# Patient Record
Sex: Male | Born: 1940 | Race: White | Hispanic: No | Marital: Married
Health system: Midwestern US, Academic
[De-identification: ages and names within clinical notes are randomized; demographics above are authoritative.]

---

## 2016-09-27 MED ORDER — SODIUM CHLORIDE 0.9 % IJ SOLN
50 mL | Freq: Once | INTRAVENOUS | 0 refills | Status: AC
Start: 2016-09-27 — End: ?

## 2016-09-27 MED ORDER — GADOBENATE DIMEGLUMINE 529 MG/ML (0.1MMOL/0.2ML) IV SOLN
16 mL | Freq: Once | INTRAVENOUS | 0 refills | Status: CP
Start: 2016-09-27 — End: ?

## 2016-10-02 MED ORDER — CIPROFLOXACIN HCL 500 MG PO TAB
500 mg | ORAL_TABLET | Freq: Two times a day (BID) | ORAL | 0 refills | 10.00000 days | Status: AC
Start: 2016-10-02 — End: ?

## 2016-10-25 ENCOUNTER — Encounter: Admit: 2016-10-25 | Discharge: 2016-10-25 | Payer: MEDICARE

## 2016-10-25 NOTE — Telephone Encounter
Called and went over pre bx instructions in detail, especially holding asa & plavix ok to hold for 7 days before appointment and 1 day after. Pt expressed understanding and has no further questions at this time.

## 2016-10-29 ENCOUNTER — Ambulatory Visit: Admit: 2016-10-29 | Discharge: 2016-10-29 | Payer: MEDICARE

## 2016-10-29 ENCOUNTER — Encounter: Admit: 2016-10-29 | Discharge: 2016-10-29 | Payer: MEDICARE

## 2016-10-29 DIAGNOSIS — R3129 Other microscopic hematuria: ICD-10-CM

## 2016-10-29 DIAGNOSIS — N400 Enlarged prostate without lower urinary tract symptoms: Principal | ICD-10-CM

## 2016-10-29 DIAGNOSIS — M5410 Radiculopathy, site unspecified: ICD-10-CM

## 2016-10-29 DIAGNOSIS — I219 Acute myocardial infarction, unspecified: ICD-10-CM

## 2016-10-29 DIAGNOSIS — I209 Angina pectoris, unspecified: Principal | ICD-10-CM

## 2016-10-29 DIAGNOSIS — I10 Essential (primary) hypertension: ICD-10-CM

## 2016-10-29 DIAGNOSIS — E785 Hyperlipidemia, unspecified: ICD-10-CM

## 2016-10-29 DIAGNOSIS — I2510 Atherosclerotic heart disease of native coronary artery without angina pectoris: ICD-10-CM

## 2016-10-29 DIAGNOSIS — R972 Elevated prostate specific antigen [PSA]: ICD-10-CM

## 2016-10-29 DIAGNOSIS — N200 Calculus of kidney: ICD-10-CM

## 2016-10-29 DIAGNOSIS — I25118 Atherosclerotic heart disease of native coronary artery with other forms of angina pectoris: ICD-10-CM

## 2016-10-29 DIAGNOSIS — M1990 Unspecified osteoarthritis, unspecified site: ICD-10-CM

## 2016-10-29 DIAGNOSIS — K649 Unspecified hemorrhoids: ICD-10-CM

## 2016-10-29 DIAGNOSIS — Z0000 Encounter for general adult medical examination without abnormal findings: ICD-10-CM

## 2016-10-29 DIAGNOSIS — J302 Other seasonal allergic rhinitis: ICD-10-CM

## 2016-10-29 NOTE — Assessment & Plan Note
-   Reactions (myopathy & elevated LFTs) to statins  - x10 cardiac stents placed  > Request records from [facility], check FLP depending on records of when last evaluated  > Continue zetia & welchol

## 2016-10-29 NOTE — Assessment & Plan Note
-   Incidental finding on CT A/P at OSH  - PSA elevated > 6 & imaging on MRI with multiple nodules seen  - Current regimen: Flomax  > Continue flomax  > Urology following, planning for fusion biopsy 11/07/16. Continue to follow with urology.

## 2016-10-29 NOTE — Assessment & Plan Note
-   Mildly elevated today w/ SBP 142  - Will continue to monitor as only value of file in past year   > Continue lopressor 50 mg BID and will continue to monitor at each visit

## 2016-10-29 NOTE — Assessment & Plan Note
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

## 2016-10-29 NOTE — Assessment & Plan Note
-   Noted on colonoscopy in 2011 to have multiple medium sized internal hemorrhoids  - Patient noticed some transient bleeding reportedly with tylenol  - Currently on DAPT  - Suspect internal hemorrhoidal bleeding with concurrent DAPT  > Instructed patient to continue tylenol as needed for radicular symptoms and to call if further bleeding to workup further  > CBC today to evaluate for anemia

## 2016-10-29 NOTE — Assessment & Plan Note
-   Follows with Dr. Gilmer Mor  - History of x4 PCI with x10 stents placed  - Current regimen: ASA 81mg  qday; plavix 75mg  qday; niacin 500mg  qday; ezetimibe 10mg  qday; colesevelam 1875mg  BID  > Continue current regimen  > Has clearance per chart from Dr. Alton Revere office to be off of DAPT 7 days prior and 24 hours post fusion biopsy of prostate

## 2016-10-29 NOTE — Progress Notes
I discussed the care of Mr.Ledbetter with Dr. Ofilia Neas, DO at the time of the visit, and I agree with the evaluation and plan as documented by this resident, unless noted below, or in the resident's note in bold, blue italics.    Problem   Arm Paresthesia, Right        Encounter Diagnoses   Name Primary?    Enlarged prostate Yes    Elevated PSA     Coronary artery disease of native heart with stable angina pectoris, unspecified vessel or lesion type [facility])     Health care maintenance     Essential hypertension     Hyperlipidemia, unspecified hyperlipidemia type         Orders Placed This Encounter    PNEUMOCOCCAL CONJ VACCINE 13-VAL       Sharilyn Sites, MD

## 2016-10-29 NOTE — Progress Notes
Date of Service: 10/29/2016    Timothy Sweeney is a 76 y.o. male. DOB: [birth year]   MRN#: [id number]    Subjective:       History of Present Illness  Timothy Sweeney presents to clinic today for 3 month follow up. He reports having right sided arm numbness that has been going on for year. He occasionally has this on the left as well. The sensation goes from his upper shoulder all the way to his wrist and occasionally into all of his fingers. It is intermittent in nature. The sensation bothers him the most at night, but nothing else seems to exacerbate it.  He has been taking tylenol extra strength and tylenol PM at night which help. Moving in the morning seems to help as well. He stopped taking tylenol due to rectal bleeding that occurred and has since ceased, this was painless and minimal described a small amount of bright red blood in toilet which he thought might be due to tylenol. The numbness/tingling is not associated with any weakness, pain, or permanent loss of sensation.    In regards to health care maintenance Timothy Sweeney has not had any recent immunizations. He had a colonoscopy in 2011 without any polyps or masses. He is currently being worked up for enlarged prostate with elevated PSA and concerning MRI. He does not smoke nor has he ever.       Review of Systems   Musculoskeletal: Positive for arthritis, joint pain and joint swelling. Negative for muscle cramps, muscle weakness, myalgias and neck pain.   Gastrointestinal: Positive for hematochezia.   Genitourinary: Negative.    Neurological: Positive for numbness and paresthesias. Negative for focal weakness and sensory change.         Objective:     ??? acetaminophen (TYLENOL) 500 mg tablet Take 1-2 tablets by mouth every 8 hours as needed for Pain. Max of 4,000 mg of acetaminophen in 24 hours.   ??? aspirin EC 81 mg tablet Take 81 mg by mouth daily.   ??? clopiDOGrel (PLAVIX) 75 mg tablet Take 75 mg by mouth daily. ??? colesevelam(+) (WELCHOL) 625 mg tablet Take 1,875 mg by mouth twice daily with meals.   ??? ezetimibe (ZETIA) 10 mg tablet Take 10 mg by mouth daily.   ??? metoprolol tartrate (LOPRESSOR) 50 mg tablet Take 50 mg by mouth twice daily.   ??? Niacin 500 mg cpER Take  by mouth.   ??? nitroglycerin (NITROSTAT) 0.4 mg tablet Place 0.4 mg under tongue every 5 minutes as needed.   ??? tamsulosin (FLOMAX) 0.4 mg capsule TAKE ONE CAPSULE BY MOUTH EVERY DAY     Vitals:    10/29/16 1039   BP: 138/80   Pulse: 60   Resp: 16   Temp: 36.7 ???C (98 ???F)   TempSrc: Oral   Weight: 82.5 kg (181 lb 12.8 oz)   Height: 185.4 cm (73)     Body mass index is 23.99 kg/m???.     Physical Exam   Constitutional: He appears well-developed and well-nourished. No distress.   HENT:   Head: Normocephalic and atraumatic.   Mouth/Throat: Oropharynx is clear and moist.   Eyes: Conjunctivae and EOM are normal. Right eye exhibits no discharge. Left eye exhibits no discharge. No scleral icterus.   Neck: Neck supple. No thyromegaly present.   Decreased AROM/PROM especially with left side bending and bilateral rotation.   Cardiovascular: Normal rate, regular rhythm, normal heart sounds and intact distal pulses.  Exam reveals no gallop  and no friction rub.    No murmur heard.  Pulmonary/Chest: Effort normal and breath sounds normal. No respiratory distress. He has no wheezes. He has no rales.   Abdominal: There is no hepatosplenomegaly. There is no rigidity, no CVA tenderness, no tenderness at McBurney's point and negative Murphy's sign. Hernia confirmed negative in the ventral area.   Musculoskeletal: Normal range of motion. He exhibits no edema, tenderness or deformity.   Full ROM in hip joint  5/5 shoulder, elbow, forearm, wrist, and grip strength without exacerbation of symptoms   No tenderness to palpation on spinous processes of neck   Neurological: He is alert.   Skin: Skin is warm and dry. No rash noted. He is not diaphoretic. No erythema. Psychiatric: He has a normal mood and affect. His behavior is normal.          Assessment and Plan:      Problem   Radiculopathy Affecting Upper Extremity   Hemorrhoids   Cad (Coronary Artery Disease)   Health Care Maintenance   Enlarged Prostate    by imaging        Hyperlipidemia   Hypertension       Hypertension  - Mildly elevated today w/ SBP 142  - Will continue to monitor as only value of file in past year   > Continue lopressor 50 mg BID and will continue to monitor at each visit    Hyperlipidemia  - Reactions (myopathy & elevated LFTs) to statins  - x10 cardiac stents placed  > Request records from [facility], check FLP depending on records of when last evaluated  > Continue zetia & welchol    Health care maintenance  - Cancer Screening:    - CRC: 03/2010 without evidence of polyps or masses, will need repeat 2021   - Prostate: Elevated PSA & concerning findings on MRI, scheduled for fusion biopsy 11/07/16   - Lung: Never a smoker  - Immunizations: Unsure when last vaccine was (no flu, pneumonia, zoster, or recent tdap)   - Prevnar 13 10/29/16  - CAD: On DAPT   - Smoker: Never a smoker  - EtOH: Non-drinker  - Substance abuse: Denies use  > Prevnar 13 today  > Recommended shingrix and tdap to patient  > CMP, lipid profile, and CMP today for medication monitoring and evaluation of possible anemia as above        CAD (coronary artery disease)  - Follows with Dr. Janyth Contes  - History of x4 PCI with x10 stents placed  - Current regimen: ASA 81mg  qday; plavix 75mg  qday; niacin 500mg  qday; ezetimibe 10mg  qday; colesevelam 1875mg  BID  > Continue current regimen  > Has clearance per chart from Dr. 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  > C-spine 6 view > Continue tylenol, instructed to not exceed 3-4 grams/day    Hemorrhoids  - Noted on colonoscopy in 2011 to have multiple medium sized internal hemorrhoids  - Patient noticed some transient bleeding reportedly with tylenol  - Currently on DAPT  - Suspect internal hemorrhoidal bleeding with concurrent DAPT  > Instructed patient to continue tylenol as needed for radicular symptoms and to call if further bleeding to workup further  > CBC  today to evaluate for anemia    Enlarged prostate  - Incidental finding on CT A/P at OSH  - PSA elevated > 6 & imaging on MRI with multiple nodules seen  - Current regimen: Flomax  > Continue flomax  > Urology following, planning for fusion biopsy 11/07/16. Continue to follow with urology.                RTC in 6 months    Patient discussed with Dr. Charna Archer.    Rosetta Posner, DO  Internal Medicine, PGY-1  Pager: [phone]

## 2016-10-31 ENCOUNTER — Ambulatory Visit: Admit: 2016-10-31 | Discharge: 2016-10-31 | Payer: MEDICARE

## 2016-10-31 DIAGNOSIS — I2510 Atherosclerotic heart disease of native coronary artery without angina pectoris: ICD-10-CM

## 2016-10-31 DIAGNOSIS — M5410 Radiculopathy, site unspecified: ICD-10-CM

## 2016-10-31 DIAGNOSIS — N400 Enlarged prostate without lower urinary tract symptoms: ICD-10-CM

## 2016-10-31 DIAGNOSIS — E785 Hyperlipidemia, unspecified: ICD-10-CM

## 2016-10-31 DIAGNOSIS — R972 Elevated prostate specific antigen [PSA]: ICD-10-CM

## 2016-10-31 DIAGNOSIS — K649 Unspecified hemorrhoids: ICD-10-CM

## 2016-10-31 DIAGNOSIS — I10 Essential (primary) hypertension: Principal | ICD-10-CM

## 2016-10-31 LAB — COMPREHENSIVE METABOLIC PANEL
Lab: 138 MMOL/L (ref >60)
Lab: 15 U/L (ref 7–56)
Lab: 29 MMOL/L (ref 21–30)
Lab: 3 (ref 3–12)
Lab: 4.7 MMOL/L (ref >60)
Lab: >60 mL/min (ref >60)
Lab: >60 mL/min (ref >60)

## 2016-10-31 LAB — LIPID PROFILE
Lab: 120 mg/dL — ABNORMAL HIGH (ref <100)
Lab: 142 mg/dL
Lab: 183 mg/dL (ref <200)
Lab: 193 mg/dL — ABNORMAL HIGH (ref <150)
Lab: 41 mg/dL (ref >40)

## 2016-10-31 LAB — CBC
Lab: 14 % (ref 11–15)
Lab: 176 K/UL (ref 150–400)
Lab: 4.9 M/UL — ABNORMAL HIGH (ref 4.4–5.5)
Lab: 5.5 10*3/uL (ref 4.5–11.0)
Lab: 8.6 FL (ref 7–11)

## 2016-11-07 ENCOUNTER — Encounter: Admit: 2016-11-07 | Discharge: 2016-11-07 | Payer: MEDICARE

## 2016-11-07 DIAGNOSIS — I10 Essential (primary) hypertension: ICD-10-CM

## 2016-11-07 DIAGNOSIS — R972 Elevated prostate specific antigen [PSA]: Principal | ICD-10-CM

## 2016-11-07 DIAGNOSIS — E785 Hyperlipidemia, unspecified: ICD-10-CM

## 2016-11-07 DIAGNOSIS — N200 Calculus of kidney: ICD-10-CM

## 2016-11-07 DIAGNOSIS — I209 Angina pectoris, unspecified: Principal | ICD-10-CM

## 2016-11-07 DIAGNOSIS — M1990 Unspecified osteoarthritis, unspecified site: ICD-10-CM

## 2016-11-07 DIAGNOSIS — R3129 Other microscopic hematuria: ICD-10-CM

## 2016-11-07 DIAGNOSIS — N400 Enlarged prostate without lower urinary tract symptoms: ICD-10-CM

## 2016-11-07 DIAGNOSIS — J302 Other seasonal allergic rhinitis: ICD-10-CM

## 2016-11-07 DIAGNOSIS — I219 Acute myocardial infarction, unspecified: ICD-10-CM

## 2016-11-07 DIAGNOSIS — I2510 Atherosclerotic heart disease of native coronary artery without angina pectoris: ICD-10-CM

## 2016-11-07 MED ADMIN — LIDOCAINE HCL 10 MG/ML (1 %) IJ SOLN [4452]: 10 mL | RECTAL | @ 19:00:00 | Stop: 2016-11-07 | NDC 63323048557

## 2016-11-07 MED ADMIN — GENTAMICIN 40 MG/ML IJ SOLN [3426]: 80 mg | INTRAMUSCULAR | @ 18:00:00 | Stop: 2016-11-07 | NDC 63323001002

## 2016-11-07 NOTE — Progress Notes
Patient signed and consented to this procedure. Pre and post instructions given.  Gentamicin 80mg  intramuscular given to the right deltoid.  After 30 minutes, the patient was brought in the procedure room, was positioned and draped.  Time out was called with presence of Dr. Jerline Pain, Eddie Dibbles the Uro[employer] rep. Abbey Chatters, and [facility] MAs  Agreed.  Patient tolerated this procedure very well.   Phill Mutter RN

## 2016-11-07 NOTE — Procedures
Pre-operative Diagnosis:  Elevated PSA, abnormal MRI of the prostate     Post-operative Diagnosis:  Same    Procedure:  MRI Fusion/TRUS guided prostate biopsy    Surgeon:  Lear Ng, MD    Anesthesia:  Local lidocaine injection    EBL:  Minimal    Condition:  Stable    Complications:  None    Findings:  No hypoechoic lesions on diagnostic ultrasound.  No corporal amylacea.  No intraprostatic calcifications.  No intravesical protrusion.    Indications for procedure:  76 y/o male with a persistently elevated PSA and an MRI demonstrating  2 PI-RADS 3-4 lesions.    Description of the procedure: Risks, benefits and alternatives were described and patient wishes to proceed. PO and IM abx were administered in anticipation of the procedure. Informed consent was obtained from the patient.  All risks, benefits and alternatives were described in great detail.  He was placed on the table in the left lateral position.  Ultrasound probe was inserted and a diagnostic ultrasound of the prostate was performed with prostate measurements and interpretation as described in the findings.  The prostate was then swept and imaged for MRI software fusion. UroNAV software was then used to register MRI and ultrasound images, with contouring performed to ensure accurate registration.  Biopsies of the target lesion along with systematic biopsy of the prostate were performed. Pressure was held after the procedure.    Specimen:  Prostate biopsy    Drains:  None    Disposition: Very strict call/return instructions were given to the patient regarding signs of retention, significant bleeding, and/or infection.

## 2016-11-07 NOTE — Progress Notes
Date of Service: 11/07/2016    Subjective:             Timothy Sweeney is a 76 y.o. male.    History of Present Illness  Timothy Sweeney presents today for his fusion prostate biopsy.        Review of Systems   Genitourinary: Positive for difficulty urinating and flank pain.   Musculoskeletal: Positive for myalgias.   Neurological: Positive for light-headedness and numbness.         Objective:          acetaminophen (TYLENOL) 500 mg tablet Take 1-2 tablets by mouth every 8 hours as needed for Pain. Max of 4,000 mg of acetaminophen in 24 hours.    aspirin EC 81 mg tablet Take 81 mg by mouth daily.    clopiDOGrel (PLAVIX) 75 mg tablet Take 75 mg by mouth daily.    colesevelam(+) (WELCHOL) 625 mg tablet Take 1,875 mg by mouth twice daily with meals.    ezetimibe (ZETIA) 10 mg tablet Take 10 mg by mouth daily.    metoprolol tartrate (LOPRESSOR) 50 mg tablet Take 50 mg by mouth twice daily.    Niacin 500 mg cpER Take  by mouth.    nitroglycerin (NITROSTAT) 0.4 mg tablet Place 0.4 mg under tongue every 5 minutes as needed.    tamsulosin (FLOMAX) 0.4 mg capsule TAKE ONE CAPSULE BY MOUTH EVERY DAY     Vitals:    11/07/16 1242   BP: 138/87   Pulse: 76   SpO2: 99%   Weight: 81.6 kg (180 lb)   Height: 186.7 cm (73.5")     Body mass index is 23.43 kg/m.     Physical Exam         Assessment and Plan:  Elevated PSA with an abnormal MRI.    Consent obtained for fusion biopsy.

## 2016-11-12 ENCOUNTER — Encounter: Admit: 2016-11-12 | Discharge: 2016-11-12 | Payer: MEDICARE

## 2016-11-12 DIAGNOSIS — R972 Elevated prostate specific antigen [PSA]: Principal | ICD-10-CM

## 2016-11-12 NOTE — Progress Notes
Reviewed pathology report with patient.  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  He was agreeable to this.  Nursing staff contacted and will set patient up for follow-up in 6 months with PSA.

## 2016-11-12 NOTE — Progress Notes
PATHOLOGY REPORT   [facility]   [email or web address]     Department of Pathology and Laboratory Medicine   [address], [place], [place] [address]   Surgical Pathology Office: ???[phone] ???Fax: ???[phone]   SURGICAL PATHOLOGY REPORT     NAME: Timothy Sweeney, Timothy Sweeney PATH #: [id number] MR #: [id number] SPECIMEN CLASS:   SI BILLING #: [id number] ALT ID #: ???LOCATION: [id number] DATE OF PROCEDURE:   11/08/2016 AGE: ???76 SEX: M DATE RECEIVED: 11/08/2016 DOB: Dec 02, 1940 ???TIME   RECEIVED: ???12:07 PHYSICIAN: Ross Marcus, MD DATE OF REPORT: 11/12/2016   COPY TO: ???DATE OF PRINTING: 11/12/2016         ########################################################################   Final Diagnosis:     A. Prostatic tissue, left medial apex, core needle biopsy:   Benign prostatic tissue. ???     B. Prostatic tissue, left medial mid, core needle biopsy: ???   Benign prostatic tissue. ???     C. Prostatic tissue, left medial base, core needle biopsy: ???   Benign prostatic tissue. ???     D. Prostatic tissue, left lateral apex, core???needle biopsy: ???   Benign prostatic tissue. ???     E. Prostatic tissue, left lateral mid, core needle biopsy: ???   Benign prostatic tissue. ???     F. Prostatic tissue, left lateral base, core needle biopsy: ???   Benign prostatic tissue. ???     G. Prostatic tissue, right medial apex, core needle biopsy: ???   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 ??? ???tissue, and measuring   1mm in length.   See comment.     H. Prostatic tissue, right medial mid, core needle biopsy: ???   Benign prostatic tissue. ???     I. Prostatic tissue, right medial base, core needle biopsy: ???   Benign prostatic tissue. ???     J. Prostatic tissue, right lateral apex, core needle biopsy: ???   Benign prostatic tissue. ???     K. Prostatic tissue, right lateral mid, core needle biopsy:   Benign prostatic tissue. ??? L. Prostatic tissue, left lateral base, core needle biopsy: ???   Benign prostatic tissue. ???     M. Prostatic tissue, lesion 1, core needle biopsy: ???   Benign prostatic tissue. ???     N. Prostatic tissue, lesion 2, core needle biopsy: ???   Benign prostatic tissue. ???   See comment.     Comment:   Immunohistochemical staining performed on blocks G1 PIN4 immunostain   (p504S, p63, and 34BE12) shows positive tumor cell staining for p504S and   absent basal cell staining for p63 and 34BE12, supporting the diagnosis of   prostatic adenocarcinoma.     Immunohistochemical staining performed on blocks N1 PIN4 immunostain   (p504S, p63, and 34BE12) shows negative staining, supporting the diagnosis   of prostatic adenocarcinoma. ???     Pursuant to the Quality Assurance Program at the [facility] Pathology Department, selected slides from this case have been   concurrently reviewed by the following pathologist: Dr. Waldo Laine who   agrees with the final diagnosis. ???     Attestation:   By this signature, I attest that I have personally formulated the final   interpretation expressed in this report and that the above diagnosis is   based upon my examination of the slides and/or other material indicated in  this report.     +++Electronically Signed Out By Da Chipper Herb, MD on 11/12/2016+++   Dwan Bolt, MD Resident       ??? ??? ??? ??? ???   ksw/11/08/2016   ??? ??? ??? ???

## 2016-11-13 ENCOUNTER — Encounter: Admit: 2016-11-13 | Discharge: 2016-11-13 | Payer: MEDICARE

## 2017-05-07 ENCOUNTER — Encounter: Admit: 2017-05-07 | Discharge: 2017-05-07 | Payer: MEDICARE

## 2017-05-07 DIAGNOSIS — R350 Frequency of micturition: ICD-10-CM

## 2017-05-07 DIAGNOSIS — C61 Malignant neoplasm of prostate: Principal | ICD-10-CM

## 2017-05-07 DIAGNOSIS — R972 Elevated prostate specific antigen [PSA]: ICD-10-CM

## 2017-05-07 DIAGNOSIS — N400 Enlarged prostate without lower urinary tract symptoms: ICD-10-CM

## 2017-05-07 LAB — PROSTATIC SPECIFIC ANTIGEN-PSA: Lab: 7.5 ng/mL — ABNORMAL HIGH (ref <6.01)

## 2017-05-07 MED ORDER — TAMSULOSIN 0.4 MG PO CAP
.4 mg | ORAL_CAPSULE | Freq: Every day | ORAL | 3 refills | 90.00000 days | Status: AC
Start: 2017-05-07 — End: 2017-11-25

## 2017-05-07 NOTE — Progress Notes
Name: Timothy Sweeney          MRN: [id number]      DOB: Apr 01, 1941      AGE: 76 y.o.   DATE OF SERVICE: 05/07/2017    Subjective:             Reason for Visit: prostate cancer    Heme/Onc Care      Timothy Sweeney is a 76 y.o. male.       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  He is currently on active surveillance.    The patient reports some urinary frequency which is better in the morning.  He reports an occasional slow stream.  His urinary symptoms have overall improved since he started Flomax but he still relates some frustration over his urinary frequency.  He denies any hematuria or dysuria.  He is accompanied by his spouse.       Review of Systems   Constitutional: Negative for activity change, appetite change, chills, fatigue, fever and unexpected weight change.   HENT: Positive for postnasal drip and sneezing. Negative for dental problem, hearing loss, mouth sores, nosebleeds, rhinorrhea, sinus pain, sore throat, tinnitus, trouble swallowing and voice change.    Eyes: Negative for visual disturbance.   Respiratory: Positive for cough. Negative for chest tightness, shortness of breath and wheezing.    Cardiovascular: Negative for chest pain, palpitations and leg swelling.   Gastrointestinal: Negative for abdominal pain, blood in stool, constipation, diarrhea, nausea and vomiting.   Genitourinary: Negative for difficulty urinating, dysuria, flank pain, frequency, hematuria and urgency.   Musculoskeletal: Positive for myalgias. Negative for arthralgias, back pain and gait problem.   Skin: Negative for rash.   Neurological: Positive for numbness. Negative for syncope, weakness, light-headedness and headaches.   Psychiatric/Behavioral: Negative for confusion and dysphoric mood. The patient is not nervous/anxious. Objective:         ??? acetaminophen (TYLENOL) 500 mg tablet Take 1-2 tablets by mouth every 8 hours as needed for Pain. Max of 4,000 mg of acetaminophen in 24 hours.   ??? aspirin EC 81 mg tablet Take 81 mg by mouth daily.   ??? clopiDOGrel (PLAVIX) 75 mg tablet Take 75 mg by mouth daily.   ??? colesevelam(+) (WELCHOL) 625 mg tablet Take 1,875 mg by mouth twice daily with meals.   ??? ezetimibe (ZETIA) 10 mg tablet Take 10 mg by mouth daily.   ??? metoprolol tartrate (LOPRESSOR) 50 mg tablet Take 50 mg by mouth twice daily.   ??? Niacin 500 mg cpER Take  by mouth.   ??? nitroglycerin (NITROSTAT) 0.4 mg tablet Place 0.4 mg under tongue every 5 minutes as needed.     Vitals:    05/07/17 1420   BP: 122/65   Pulse: 69   Resp: 16   Temp: 36.8 ???C (98.3 ???F)   TempSrc: Oral   SpO2: 99%   Weight: 83.1 kg (183 lb 3.2 oz)   Height: 186.7 cm (73.5)     Body mass index is 23.84 kg/m???.     Pain Score: Zero       Pain Addressed:  N/A    Patient Evaluated for a Clinical Trial: Patient not eligible for a treatment trial (including not needing treatment, needs palliative care, in remission).     [place] [employer] performance status is 0, Fully active, able to carry on all pre-disease performance without restriction.Marland Kitchen     Physical Exam   Constitutional: He is oriented to person, place, and time. He appears well-developed and well-nourished. No distress.   HENT:   Head: Normocephalic and atraumatic.   Eyes: EOM are normal. No scleral icterus.   Neck: Normal range of motion.   Pulmonary/Chest: Effort normal.   Musculoskeletal: Normal range of motion. He exhibits no edema.   Neurological: He is alert and oriented to person, place, and time. He has normal reflexes.   Skin: Skin is warm and dry. He is not diaphoretic.   Psychiatric: He has a normal mood and affect. His behavior is normal. Judgment and thought content normal.         PVR today was 38 mL    Lab Results   Component Value Date    PSA 7.55 (H) 05/07/2017 PSA 6.97 (H) 09/11/2016    PSA 6.90 (H) 07/10/2016     11/13/2016 ???2:54 PM - Interface, In Results Misys     Component   PATHOLOGY REPORT   [facility]   [email or web address]     Department of Pathology and Laboratory Medicine   [address] [place], [place] [address]   Surgical Pathology Office: ???[phone] ???Fax: ???[phone]   SURGICAL PATHOLOGY REPORT     NAME: Sweeney, Timothy PATH #: [id number] MR #: [id number] SPECIMEN CLASS:   SI BILLING #: [id number] ALT ID #: ???LOCATION: [id number] DATE OF PROCEDURE:   11/07/2016 AGE: ???76 SEX: M DATE RECEIVED: 11/08/2016 DOB: [birth year] ???TIME   RECEIVED: ???12:07 PHYSICIAN: Ross Marcus, MD DATE OF REPORT: 11/12/2016   COPY TO: ???DATE OF PRINTING: 11/13/2016         ########################################################################   Final Diagnosis:     A. Prostatic tissue, left medial apex, core needle biopsy:   Benign prostatic tissue. ???     B. Prostatic tissue, left medial mid, core needle biopsy: ???   Benign prostatic tissue. ???     C. Prostatic tissue, left medial base, core needle biopsy: ???   Benign prostatic tissue. ???     D. Prostatic tissue, left lateral apex, core???needle biopsy: ???   Benign prostatic tissue. ???     E. Prostatic tissue, left lateral mid, core needle biopsy: ???   Benign prostatic tissue. ???     F. Prostatic tissue, left lateral base, core needle biopsy: ???   Benign prostatic tissue. ???     G. Prostatic tissue, right medial apex, core needle biopsy: ???   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 ??? ???tissue, and measuring   1mm in length.   See comment.     H. Prostatic tissue, right medial mid, core needle biopsy: ???   Benign prostatic tissue. ???     I. Prostatic tissue, right medial base, core needle biopsy: ???   Benign prostatic tissue. ???     J. Prostatic tissue, right lateral apex, core needle biopsy: ???   Benign prostatic tissue. ??? K. Prostatic tissue, right lateral mid, core needle biopsy:   Benign prostatic tissue. ???     L. Prostatic tissue, left lateral base, core needle biopsy: ???   Benign prostatic tissue. ???     M. Prostatic tissue, lesion 1, core needle biopsy: ???   Benign prostatic tissue. ???  N. Prostatic tissue, lesion 2, core needle biopsy: ???   Benign prostatic tissue. ???   See comment.     Comment:   Immunohistochemical staining performed on blocks G1 PIN4 immunostain   (p504S, p63, and 34BE12) shows positive tumor cell staining for p504S and   absent basal cell staining for p63 and 34BE12, supporting the diagnosis of   prostatic adenocarcinoma.     Immunohistochemical staining performed on blocks N1 PIN4 immunostain   (p504S, p63, and 34BE12) shows negative staining, supporting the diagnosis   of prostatic adenocarcinoma. ???     Pursuant to the Quality Assurance Program at the [facility] Pathology Department, selected slides from this case have been   concurrently reviewed by the following pathologist: Dr. Waldo Laine who   agrees with the final diagnosis. ???     Attestation:   By this signature, I attest that I have personally formulated the final   interpretation expressed in this report and that the above diagnosis is   based upon my examination of the slides and/or other material indicated in   this report.     +++Electronically Signed Out By Da Chipper Herb, MD on 11/12/2016+++   Dwan Bolt, MD Resident       ??? ??? ??? ??? ???   ksw/11/08/2016   ??? ??? ??? ???         Assessment and Plan:    Problem   Prostate Cancer (Hcc)    07/10/2016 PSA 6.9  09/11/2016 PSA 6.97  09/27/2016 - MRI prostate - results as follows:  1. No large focal lesion to suggest large volume, high-grade disease.  2. Several subcentimeter peripheral zone nodules, the largest of which   within the posterior left mid gland demonstrates marked restricted   diffusion and enhancement, most suggestive of small volume, high-grade disease (PI-RADS 4). ???This is contoured in the Dynacad system as lesion 1.   Additional peripheral zone lesions are equivocal for high-grade disease   (PI-RADS 3).  3. Small ill-defined nodule in the left lateral prostate base, which is   indeterminate between a peripheral zone lesion or extruded transition zone   nodule and should be considered for high-grade disease (PI-RADS 4). This   is contoured in the Dynacad system as lesion 2.  4. Moderate nodular hyperplasia with several extruded nodules arising from   the transition zone (PI-RADS 2).    11/08/2016:  Targeted prostate biopsy:    One core showed Gleason grade 3+3=6 adenocarcinoma of the prostate involving 5% of needle core and measuring 1 mm in length       Gleason 3+3 = 6 adenocarcinoma the prostate diagnosed in June 2018 for which he is on active surveillance.  We discussed that today's PSA is 7.55.  We reviewed the protocol for active surveillance which includes a repeat prostate biopsy within 1-2 years.  We discussed that the rationale for this is to evaluate for any higher grade prostate cancers which may require intervention.    At this time, we will plan on seeing him back in 6 months with a repeat PSA.  We will then proceed with a repeat prostate biopsy 1 year from now.    With regards to his urinary symptoms, he will continue with his Flomax.  A 90-day prescription was sent electronically to his pharmacy.    The above plan was discussed in detail with the patient and his spouse.  All questions were answered to his apparent satisfaction and he was  asked to contact us anytime with questions or concerns.    Jennifer Heins, PA-C       ATTESTATION    I personally interviewed and examined the patient.  I have reviewed the history, physical, impression and plan outlined by the Physician Assistant.    The patient presents with (HPI) a history of low risk prostate cancer on active surveillance.  He has had some issues with progressive lower urinary tract symptoms.,  On examination there is no evidence of a concerning PSA rise:    Lab Results   Component Value Date    PSA 7.55 (H) 05/07/2017    PSA 6.97 (H) 09/11/2016    PSA 6.90 (H) 07/10/2016     He also has an acceptable PVR.  ,   My impression is that he has BPH and low risk prostate cancer,  My plan is to have him return in 6 months with a PSA and a biopsy at 1 year from now (18 months from diagnosis).      Staff name:  Ross Marcus, MD Date:  05/07/2017

## 2017-07-08 ENCOUNTER — Ambulatory Visit: Admit: 2017-07-08 | Discharge: 2017-07-08 | Payer: MEDICARE

## 2017-07-08 ENCOUNTER — Encounter: Admit: 2017-07-08 | Discharge: 2017-07-08 | Payer: MEDICARE

## 2017-07-08 DIAGNOSIS — I219 Acute myocardial infarction, unspecified: ICD-10-CM

## 2017-07-08 DIAGNOSIS — N400 Enlarged prostate without lower urinary tract symptoms: ICD-10-CM

## 2017-07-08 DIAGNOSIS — M1990 Unspecified osteoarthritis, unspecified site: ICD-10-CM

## 2017-07-08 DIAGNOSIS — C61 Malignant neoplasm of prostate: ICD-10-CM

## 2017-07-08 DIAGNOSIS — M5410 Radiculopathy, site unspecified: ICD-10-CM

## 2017-07-08 DIAGNOSIS — I10 Essential (primary) hypertension: ICD-10-CM

## 2017-07-08 DIAGNOSIS — N200 Calculus of kidney: ICD-10-CM

## 2017-07-08 DIAGNOSIS — I209 Angina pectoris, unspecified: Principal | ICD-10-CM

## 2017-07-08 DIAGNOSIS — M5412 Radiculopathy, cervical region: Principal | ICD-10-CM

## 2017-07-08 DIAGNOSIS — R3129 Other microscopic hematuria: ICD-10-CM

## 2017-07-08 DIAGNOSIS — J302 Other seasonal allergic rhinitis: ICD-10-CM

## 2017-07-08 DIAGNOSIS — I2510 Atherosclerotic heart disease of native coronary artery without angina pectoris: ICD-10-CM

## 2017-07-08 DIAGNOSIS — E785 Hyperlipidemia, unspecified: ICD-10-CM

## 2017-07-08 DIAGNOSIS — Z0000 Encounter for general adult medical examination without abnormal findings: ICD-10-CM

## 2017-07-08 MED ORDER — GABAPENTIN 100 MG PO CAP
100 mg | ORAL_CAPSULE | ORAL | 3 refills | Status: AC
Start: 2017-07-08 — End: 2017-11-25

## 2017-07-16 ENCOUNTER — Encounter: Admit: 2017-07-16 | Discharge: 2017-07-16 | Payer: MEDICARE

## 2017-07-16 DIAGNOSIS — M5412 Radiculopathy, cervical region: Principal | ICD-10-CM

## 2017-07-29 ENCOUNTER — Encounter: Admit: 2017-07-29 | Discharge: 2017-07-29 | Payer: MEDICARE

## 2017-08-13 ENCOUNTER — Ambulatory Visit: Admit: 2017-08-13 | Discharge: 2017-08-13 | Payer: MEDICARE

## 2017-08-13 ENCOUNTER — Encounter: Admit: 2017-08-13 | Discharge: 2017-08-13 | Payer: MEDICARE

## 2017-08-13 DIAGNOSIS — J302 Other seasonal allergic rhinitis: ICD-10-CM

## 2017-08-13 DIAGNOSIS — I2510 Atherosclerotic heart disease of native coronary artery without angina pectoris: ICD-10-CM

## 2017-08-13 DIAGNOSIS — I209 Angina pectoris, unspecified: Principal | ICD-10-CM

## 2017-08-13 DIAGNOSIS — I219 Acute myocardial infarction, unspecified: ICD-10-CM

## 2017-08-13 DIAGNOSIS — N400 Enlarged prostate without lower urinary tract symptoms: ICD-10-CM

## 2017-08-13 DIAGNOSIS — N200 Calculus of kidney: ICD-10-CM

## 2017-08-13 DIAGNOSIS — I10 Essential (primary) hypertension: ICD-10-CM

## 2017-08-13 DIAGNOSIS — R3129 Other microscopic hematuria: ICD-10-CM

## 2017-08-13 DIAGNOSIS — M1990 Unspecified osteoarthritis, unspecified site: ICD-10-CM

## 2017-08-13 DIAGNOSIS — E785 Hyperlipidemia, unspecified: ICD-10-CM

## 2017-08-14 ENCOUNTER — Encounter: Admit: 2017-08-14 | Discharge: 2017-08-14 | Payer: MEDICARE

## 2017-08-14 DIAGNOSIS — I10 Essential (primary) hypertension: ICD-10-CM

## 2017-08-14 DIAGNOSIS — N400 Enlarged prostate without lower urinary tract symptoms: ICD-10-CM

## 2017-08-14 DIAGNOSIS — I2510 Atherosclerotic heart disease of native coronary artery without angina pectoris: ICD-10-CM

## 2017-08-14 DIAGNOSIS — I219 Acute myocardial infarction, unspecified: ICD-10-CM

## 2017-08-14 DIAGNOSIS — R3129 Other microscopic hematuria: ICD-10-CM

## 2017-08-14 DIAGNOSIS — I209 Angina pectoris, unspecified: Principal | ICD-10-CM

## 2017-08-14 DIAGNOSIS — N200 Calculus of kidney: ICD-10-CM

## 2017-08-14 DIAGNOSIS — E785 Hyperlipidemia, unspecified: ICD-10-CM

## 2017-08-14 DIAGNOSIS — J302 Other seasonal allergic rhinitis: ICD-10-CM

## 2017-08-14 DIAGNOSIS — M1990 Unspecified osteoarthritis, unspecified site: ICD-10-CM

## 2017-08-28 ENCOUNTER — Encounter: Admit: 2017-08-28 | Discharge: 2017-08-28 | Payer: MEDICARE

## 2017-10-17 ENCOUNTER — Encounter: Admit: 2017-10-17 | Discharge: 2017-10-17 | Payer: MEDICARE

## 2017-11-05 ENCOUNTER — Encounter: Admit: 2017-11-05 | Discharge: 2017-11-05 | Payer: MEDICARE

## 2017-11-05 ENCOUNTER — Ambulatory Visit: Admit: 2017-11-05 | Discharge: 2017-11-05 | Payer: MEDICARE

## 2017-11-05 DIAGNOSIS — R3129 Other microscopic hematuria: ICD-10-CM

## 2017-11-05 DIAGNOSIS — I10 Essential (primary) hypertension: ICD-10-CM

## 2017-11-05 DIAGNOSIS — J302 Other seasonal allergic rhinitis: ICD-10-CM

## 2017-11-05 DIAGNOSIS — R42 Dizziness and giddiness: ICD-10-CM

## 2017-11-05 DIAGNOSIS — R55 Syncope and collapse: ICD-10-CM

## 2017-11-05 DIAGNOSIS — I219 Acute myocardial infarction, unspecified: ICD-10-CM

## 2017-11-05 DIAGNOSIS — I209 Angina pectoris, unspecified: Principal | ICD-10-CM

## 2017-11-05 DIAGNOSIS — E785 Hyperlipidemia, unspecified: ICD-10-CM

## 2017-11-05 DIAGNOSIS — M1990 Unspecified osteoarthritis, unspecified site: ICD-10-CM

## 2017-11-05 DIAGNOSIS — N200 Calculus of kidney: ICD-10-CM

## 2017-11-05 DIAGNOSIS — I951 Orthostatic hypotension: ICD-10-CM

## 2017-11-05 DIAGNOSIS — I2510 Atherosclerotic heart disease of native coronary artery without angina pectoris: ICD-10-CM

## 2017-11-05 DIAGNOSIS — C61 Malignant neoplasm of prostate: Principal | ICD-10-CM

## 2017-11-05 DIAGNOSIS — N400 Enlarged prostate without lower urinary tract symptoms: ICD-10-CM

## 2017-11-05 LAB — PROSTATIC SPECIFIC ANTIGEN-PSA: Lab: 5.9 ng/mL (ref <6.01)

## 2017-11-25 ENCOUNTER — Ambulatory Visit: Admit: 2017-11-25 | Discharge: 2017-11-26 | Payer: MEDICARE

## 2017-11-25 ENCOUNTER — Encounter: Admit: 2017-11-25 | Discharge: 2017-11-25 | Payer: MEDICARE

## 2017-11-25 DIAGNOSIS — C61 Malignant neoplasm of prostate: Secondary | ICD-10-CM

## 2017-11-25 DIAGNOSIS — R3129 Other microscopic hematuria: ICD-10-CM

## 2017-11-25 DIAGNOSIS — M1990 Unspecified osteoarthritis, unspecified site: ICD-10-CM

## 2017-11-25 DIAGNOSIS — I219 Acute myocardial infarction, unspecified: ICD-10-CM

## 2017-11-25 DIAGNOSIS — J302 Other seasonal allergic rhinitis: ICD-10-CM

## 2017-11-25 DIAGNOSIS — I209 Angina pectoris, unspecified: Principal | ICD-10-CM

## 2017-11-25 DIAGNOSIS — N400 Enlarged prostate without lower urinary tract symptoms: ICD-10-CM

## 2017-11-25 DIAGNOSIS — I2510 Atherosclerotic heart disease of native coronary artery without angina pectoris: ICD-10-CM

## 2017-11-25 DIAGNOSIS — E785 Hyperlipidemia, unspecified: ICD-10-CM

## 2017-11-25 DIAGNOSIS — I10 Essential (primary) hypertension: ICD-10-CM

## 2017-11-25 DIAGNOSIS — R109 Unspecified abdominal pain: ICD-10-CM

## 2017-11-25 DIAGNOSIS — N200 Calculus of kidney: ICD-10-CM

## 2017-11-26 ENCOUNTER — Encounter: Admit: 2017-11-26 | Discharge: 2017-11-26 | Payer: MEDICARE

## 2017-11-26 DIAGNOSIS — R42 Dizziness and giddiness: ICD-10-CM

## 2017-11-26 DIAGNOSIS — Z13820 Encounter for screening for osteoporosis: ICD-10-CM

## 2017-11-26 DIAGNOSIS — Z0000 Encounter for general adult medical examination without abnormal findings: ICD-10-CM

## 2017-11-26 DIAGNOSIS — M5410 Radiculopathy, site unspecified: Principal | ICD-10-CM

## 2017-11-26 DIAGNOSIS — R202 Paresthesia of skin: ICD-10-CM

## 2017-11-26 MED ORDER — FINASTERIDE 5 MG PO TAB
5 mg | ORAL_TABLET | Freq: Every day | ORAL | 3 refills | Status: AC
Start: 2017-11-26 — End: 2018-10-02

## 2018-01-06 ENCOUNTER — Encounter: Admit: 2018-01-06 | Discharge: 2018-01-06 | Payer: MEDICARE

## 2018-01-16 ENCOUNTER — Encounter: Admit: 2018-01-16 | Discharge: 2018-01-16 | Payer: MEDICARE

## 2018-01-20 ENCOUNTER — Encounter: Admit: 2018-01-20 | Discharge: 2018-01-20 | Payer: MEDICARE

## 2018-01-20 ENCOUNTER — Ambulatory Visit: Admit: 2018-01-20 | Discharge: 2018-01-21 | Payer: MEDICARE

## 2018-01-20 DIAGNOSIS — C61 Malignant neoplasm of prostate: ICD-10-CM

## 2018-01-20 DIAGNOSIS — R109 Unspecified abdominal pain: ICD-10-CM

## 2018-01-20 DIAGNOSIS — R3129 Other microscopic hematuria: ICD-10-CM

## 2018-01-20 DIAGNOSIS — N200 Calculus of kidney: ICD-10-CM

## 2018-01-20 DIAGNOSIS — Z0000 Encounter for general adult medical examination without abnormal findings: Secondary | ICD-10-CM

## 2018-01-20 DIAGNOSIS — M1990 Unspecified osteoarthritis, unspecified site: ICD-10-CM

## 2018-01-20 DIAGNOSIS — I209 Angina pectoris, unspecified: Principal | ICD-10-CM

## 2018-01-20 DIAGNOSIS — I2510 Atherosclerotic heart disease of native coronary artery without angina pectoris: ICD-10-CM

## 2018-01-20 DIAGNOSIS — I219 Acute myocardial infarction, unspecified: ICD-10-CM

## 2018-01-20 DIAGNOSIS — J302 Other seasonal allergic rhinitis: ICD-10-CM

## 2018-01-20 DIAGNOSIS — N400 Enlarged prostate without lower urinary tract symptoms: ICD-10-CM

## 2018-01-20 DIAGNOSIS — E785 Hyperlipidemia, unspecified: ICD-10-CM

## 2018-01-20 DIAGNOSIS — I10 Essential (primary) hypertension: ICD-10-CM

## 2018-01-21 DIAGNOSIS — I10 Essential (primary) hypertension: ICD-10-CM

## 2018-01-21 DIAGNOSIS — M899 Disorder of bone, unspecified: Principal | ICD-10-CM

## 2018-01-21 DIAGNOSIS — M5410 Radiculopathy, site unspecified: ICD-10-CM

## 2018-01-21 DIAGNOSIS — E559 Vitamin D deficiency, unspecified: Secondary | ICD-10-CM

## 2018-01-21 DIAGNOSIS — R42 Dizziness and giddiness: ICD-10-CM

## 2018-01-21 DIAGNOSIS — Z13820 Encounter for screening for osteoporosis: Secondary | ICD-10-CM

## 2018-01-24 ENCOUNTER — Encounter: Admit: 2018-01-24 | Discharge: 2018-01-24 | Payer: MEDICARE

## 2018-01-24 DIAGNOSIS — I10 Essential (primary) hypertension: ICD-10-CM

## 2018-01-24 DIAGNOSIS — N200 Calculus of kidney: ICD-10-CM

## 2018-01-24 DIAGNOSIS — E785 Hyperlipidemia, unspecified: ICD-10-CM

## 2018-01-24 DIAGNOSIS — R109 Unspecified abdominal pain: ICD-10-CM

## 2018-01-24 DIAGNOSIS — I2510 Atherosclerotic heart disease of native coronary artery without angina pectoris: ICD-10-CM

## 2018-01-24 DIAGNOSIS — N400 Enlarged prostate without lower urinary tract symptoms: ICD-10-CM

## 2018-01-24 DIAGNOSIS — I209 Angina pectoris, unspecified: Principal | ICD-10-CM

## 2018-01-24 DIAGNOSIS — I219 Acute myocardial infarction, unspecified: ICD-10-CM

## 2018-01-24 DIAGNOSIS — J302 Other seasonal allergic rhinitis: ICD-10-CM

## 2018-01-24 DIAGNOSIS — C61 Malignant neoplasm of prostate: ICD-10-CM

## 2018-01-24 DIAGNOSIS — R3129 Other microscopic hematuria: ICD-10-CM

## 2018-01-24 DIAGNOSIS — M1990 Unspecified osteoarthritis, unspecified site: ICD-10-CM

## 2018-01-28 ENCOUNTER — Ambulatory Visit: Admit: 2018-01-28 | Discharge: 2018-01-28 | Payer: MEDICARE

## 2018-01-28 DIAGNOSIS — M5412 Radiculopathy, cervical region: ICD-10-CM

## 2018-01-28 DIAGNOSIS — Z13820 Encounter for screening for osteoporosis: Secondary | ICD-10-CM

## 2018-01-28 DIAGNOSIS — G5603 Carpal tunnel syndrome, bilateral upper limbs: Principal | ICD-10-CM

## 2018-01-28 DIAGNOSIS — E559 Vitamin D deficiency, unspecified: ICD-10-CM

## 2018-01-28 DIAGNOSIS — M899 Disorder of bone, unspecified: ICD-10-CM

## 2018-01-28 DIAGNOSIS — G5622 Lesion of ulnar nerve, left upper limb: ICD-10-CM

## 2018-01-28 DIAGNOSIS — Z0000 Encounter for general adult medical examination without abnormal findings: ICD-10-CM

## 2018-01-28 LAB — CBC AND DIFF
Lab: 0.1 10*3/uL (ref 0–0.20)
Lab: 4.8 M/UL (ref 4.4–5.5)
Lab: 5.7 10*3/uL (ref 4.5–11.0)

## 2018-01-28 LAB — COMPREHENSIVE METABOLIC PANEL
Lab: 1 mg/dL (ref 0.4–1.24)
Lab: 10 U/L (ref 7–56)
Lab: 109 mg/dL — ABNORMAL HIGH (ref 70–100)
Lab: 138 MMOL/L (ref 137–147)
Lab: 27 MMOL/L (ref 21–30)
Lab: 4.8 MMOL/L (ref 3.5–5.1)
Lab: 5 K/UL (ref 3–12)
Lab: 9.6 mg/dL (ref 8.5–10.6)
Lab: >60 mL/min (ref >60)
Lab: >60 mL/min (ref >60)

## 2018-01-28 LAB — 25-OH VITAMIN D (D2 + D3): Lab: 28 ng/mL — ABNORMAL LOW (ref 30–80)

## 2018-01-28 LAB — HEMOGLOBIN A1C: Lab: 5.4 % (ref 4.0–6.0)

## 2018-01-29 ENCOUNTER — Encounter: Admit: 2018-01-29 | Discharge: 2018-01-29 | Payer: MEDICARE

## 2018-01-29 DIAGNOSIS — E559 Vitamin D deficiency, unspecified: Principal | ICD-10-CM

## 2018-01-29 MED ORDER — CHOLECALCIFEROL (VITAMIN D3) 1,000 UNIT (25 MCG) PO TAB
1000 [IU] | Freq: Every day | ORAL | 0 refills | 84.00000 days | Status: AC
Start: 2018-01-29 — End: ?

## 2018-02-03 ENCOUNTER — Encounter: Admit: 2018-02-03 | Discharge: 2018-02-03 | Payer: MEDICARE

## 2018-02-03 DIAGNOSIS — G5622 Lesion of ulnar nerve, left upper limb: ICD-10-CM

## 2018-02-03 DIAGNOSIS — G5601 Carpal tunnel syndrome, right upper limb: Principal | ICD-10-CM

## 2018-02-03 MED ORDER — ARM BRACE MISC MISC
Freq: Every evening | 1 refills | Status: AC
Start: 2018-02-03 — End: 2019-01-06

## 2018-03-06 ENCOUNTER — Encounter: Admit: 2018-03-06 | Discharge: 2018-03-06 | Payer: MEDICARE

## 2018-04-07 IMAGING — CT Abdomen^1_KIDNEY_STONE (Adult)
1 series · 15 of 32 positions shown, 19 images · non-contrast
Comparison: none

[Series 2: kidney stone 3.0 soft tissue · axial · 0.88mm/px · z∈[-487,-85]mm · 15 of 148 slices shown, 19 images]
[im 10/148  soft-tissue]
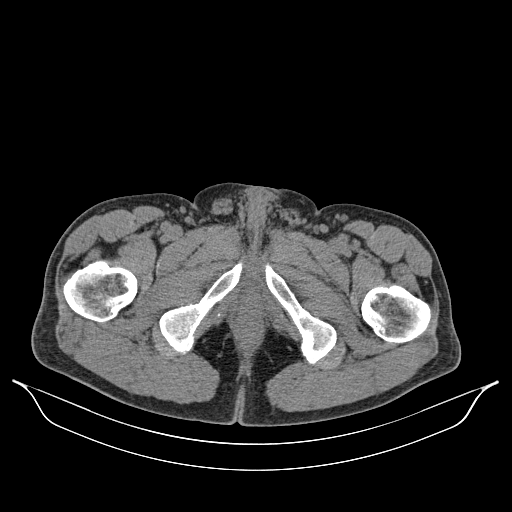
[im 10/148  bone]
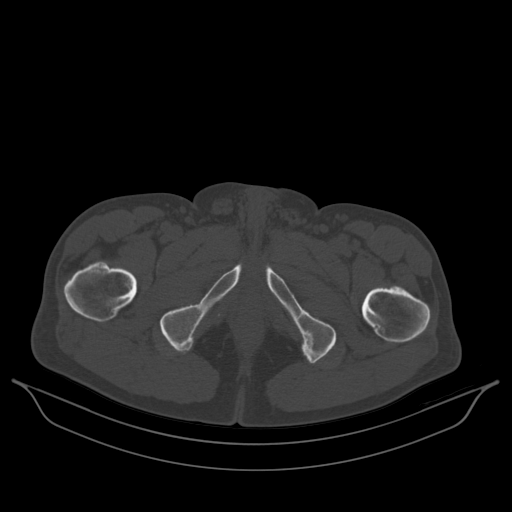
[im 19/148  soft-tissue]
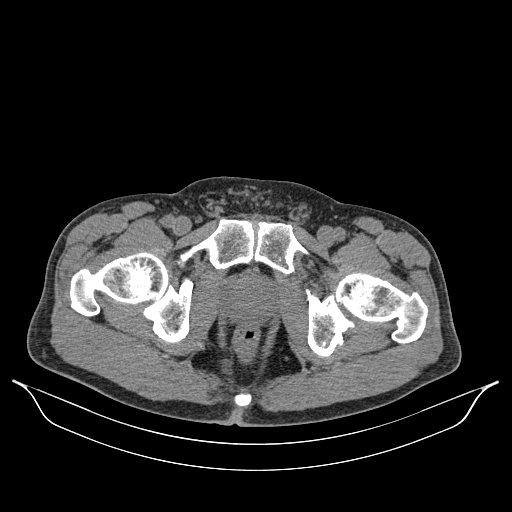
[im 29/148  soft-tissue]
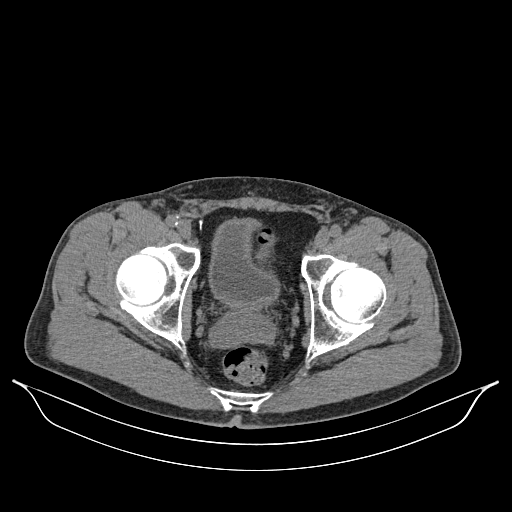
[im 43/148  soft-tissue]
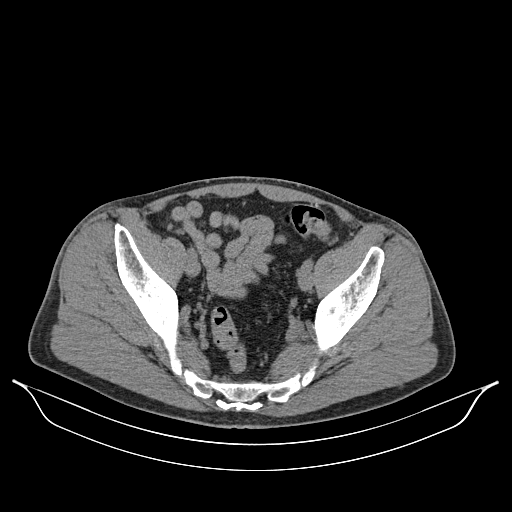
[im 53/148  soft-tissue]
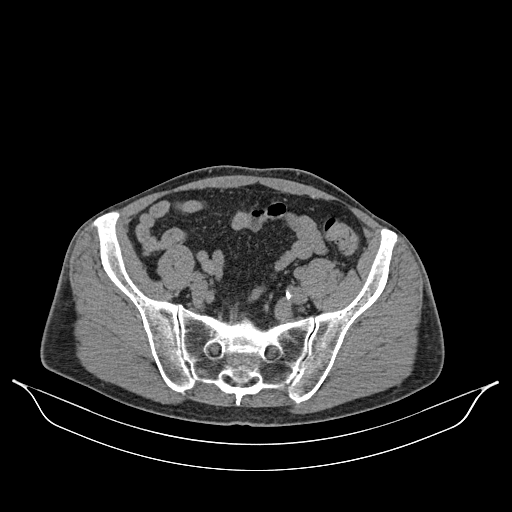
[im 62/148  soft-tissue]
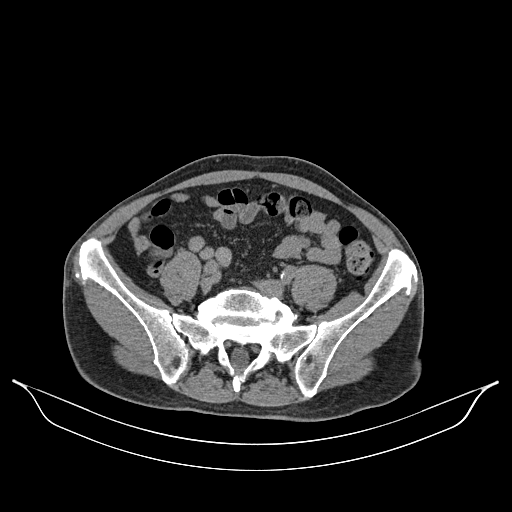
[im 76/148  soft-tissue]
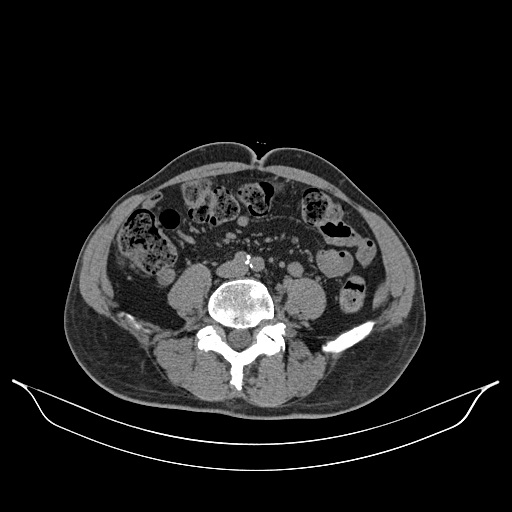
[im 86/148  soft-tissue]
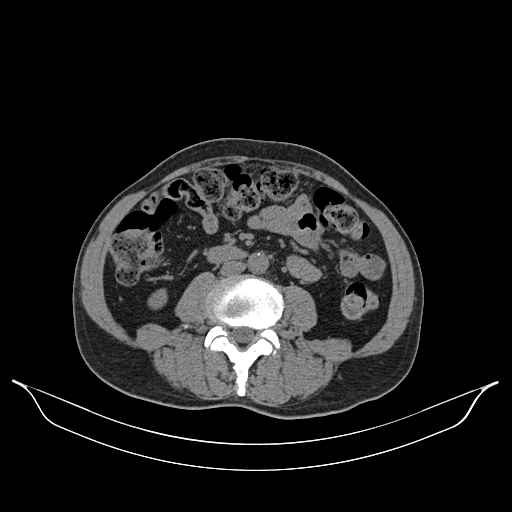
[im 95/148  soft-tissue]
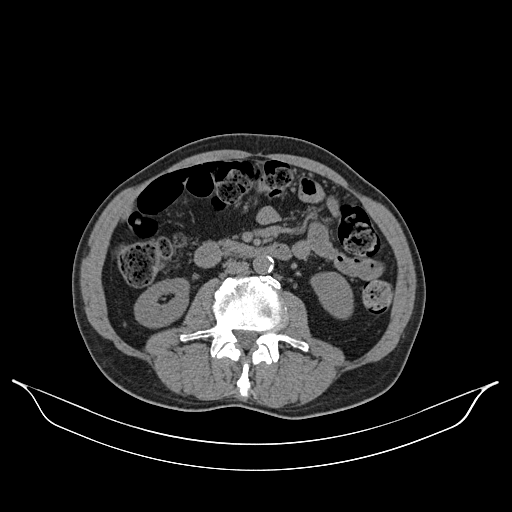
[im 95/148  bone]
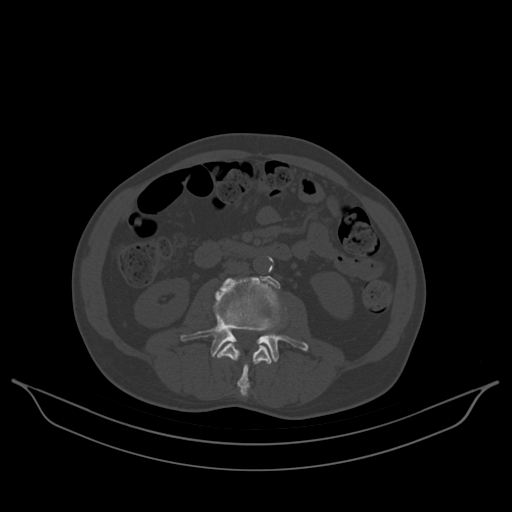
[im 105/148  soft-tissue]
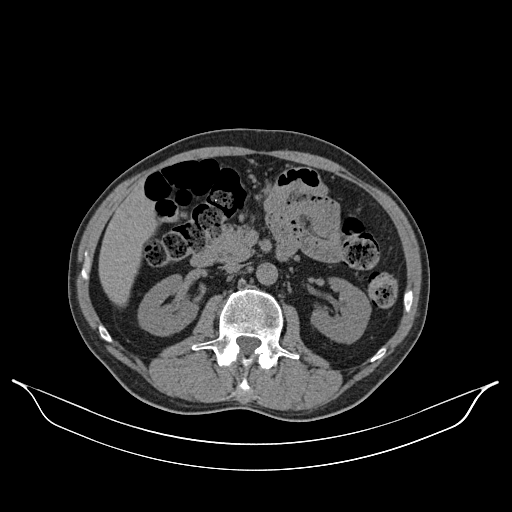
[im 119/148  soft-tissue]
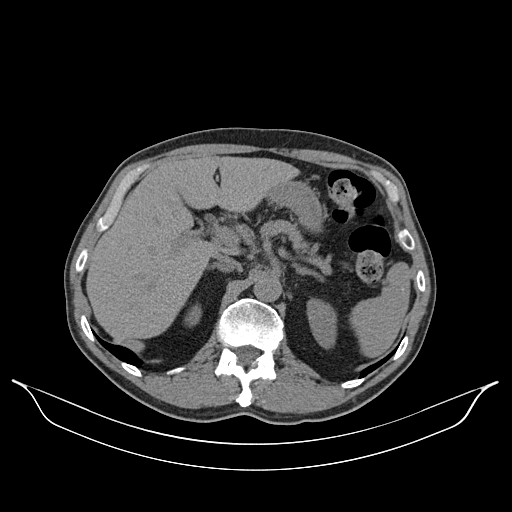
[im 129/148  soft-tissue]
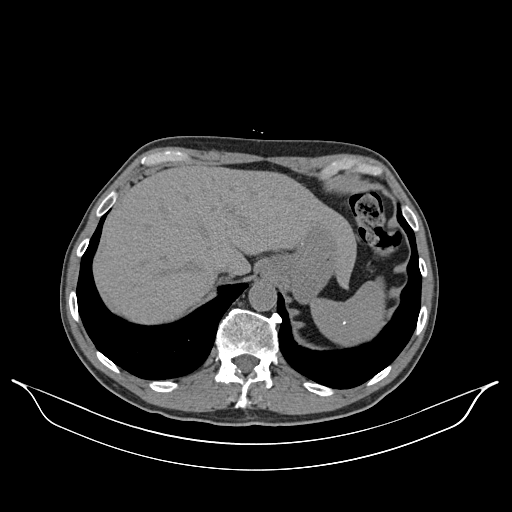
[im 129/148  lung]
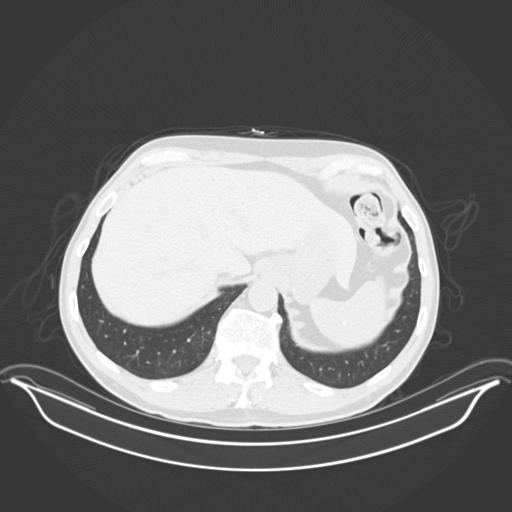
[im 133/148  lung]
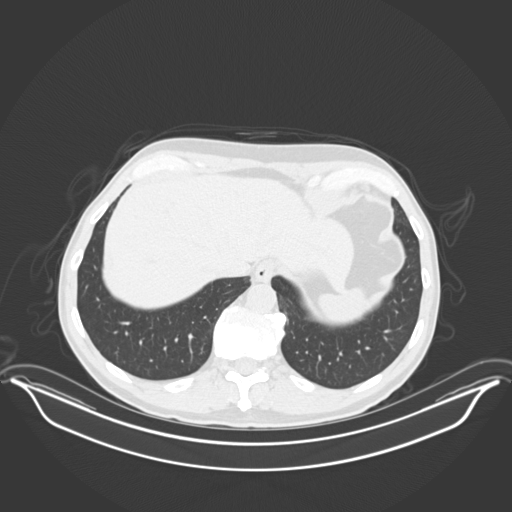
[im 138/148  soft-tissue]
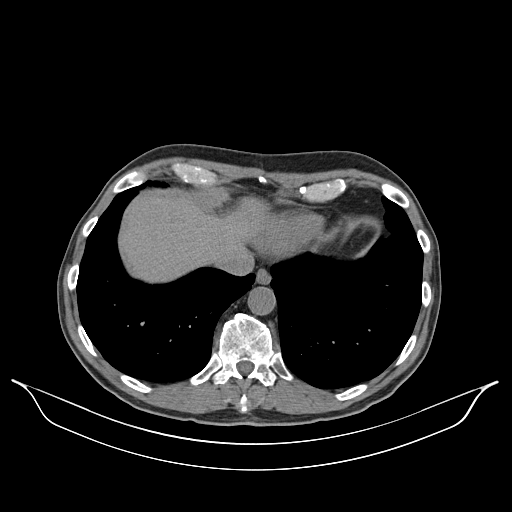
[im 138/148  lung]
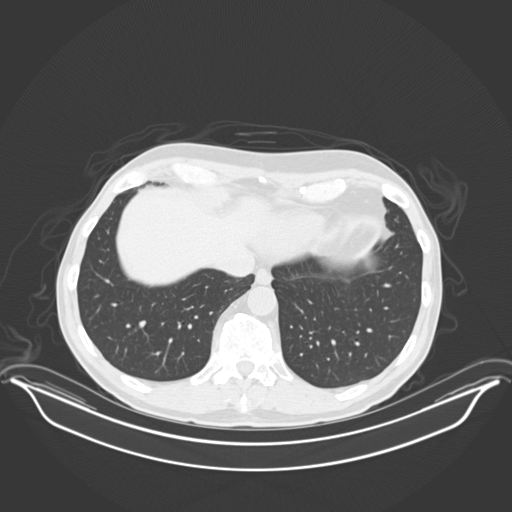
[im 143/148  lung]
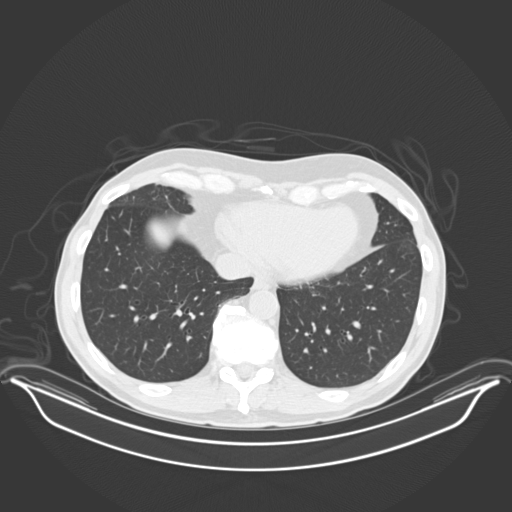

[15 of 32 positions shown; findings below may reference images not displayed]

DIAGNOSTIC STUDIES

EXAM
COMPUTED TOMOGRAPHY, ABDOMEN AND PELVIS; WITHOUT CONTRAST MATERIAL CPT 52650

INDICATION
R FLANK PAIN
RT FLANK PAIN. HX OF KIDNEY STONES AND HERNIA.

TECHNIQUE
Contiguous axial tomographic images were obtained from the lung bases to the symphysis pubis
without contrast.
All CT scans at this facility use dose modulation, iterative reconstruction, and/or weight based
dosing when appropriate to reduce radiation dose to as low as reasonably achievable.

COMPARISONS
10/20/2016

FINDINGS
[The lung bases are clear. Bochdalek's type hernia on the right. The liver, spleen, pancreas,
gallbladder and adrenal glands appear grossly unremarkable. There is no hydronephrosis.
centimeter cyst within the upper pole of the left kidney. There are 2 nonobstructive right renal
calculi, measuring up to 4 millimeters. There is no bowel obstruction or free air. Severe sigmoid
diverticulosis. Moderate amount of stool. Mild thickening of the distal esophagus. Mild gastric
mucosal thickening. Mild atherosclerotic disease. There are subcentimeter retroperitoneal nodes.
The urinary bladder is incompletely distended. Severely enlarged prostate. Severe multilevel
degenerative changes within the lower lumbar spine.

IMPRESSION

1. Redemonstration of 2 nonobstructive right renal calculi, measuring up to 4 millimeters.
2. Severely enlarged prostate.
3. Mild esophagitis. There is at least mild gastritis.

## 2018-04-14 ENCOUNTER — Ambulatory Visit: Admit: 2018-04-14 | Discharge: 2018-04-15 | Payer: MEDICARE

## 2018-04-14 ENCOUNTER — Encounter: Admit: 2018-04-14 | Discharge: 2018-04-14 | Payer: MEDICARE

## 2018-04-14 DIAGNOSIS — I209 Angina pectoris, unspecified: Principal | ICD-10-CM

## 2018-04-14 DIAGNOSIS — I10 Essential (primary) hypertension: ICD-10-CM

## 2018-04-14 DIAGNOSIS — I2510 Atherosclerotic heart disease of native coronary artery without angina pectoris: ICD-10-CM

## 2018-04-14 DIAGNOSIS — N400 Enlarged prostate without lower urinary tract symptoms: ICD-10-CM

## 2018-04-14 DIAGNOSIS — M1990 Unspecified osteoarthritis, unspecified site: ICD-10-CM

## 2018-04-14 DIAGNOSIS — E785 Hyperlipidemia, unspecified: ICD-10-CM

## 2018-04-14 DIAGNOSIS — R109 Unspecified abdominal pain: ICD-10-CM

## 2018-04-14 DIAGNOSIS — C61 Malignant neoplasm of prostate: ICD-10-CM

## 2018-04-14 DIAGNOSIS — N200 Calculus of kidney: ICD-10-CM

## 2018-04-14 DIAGNOSIS — R3129 Other microscopic hematuria: ICD-10-CM

## 2018-04-14 DIAGNOSIS — M5030 Other cervical disc degeneration, unspecified cervical region: Secondary | ICD-10-CM

## 2018-04-14 DIAGNOSIS — I219 Acute myocardial infarction, unspecified: ICD-10-CM

## 2018-04-14 DIAGNOSIS — J302 Other seasonal allergic rhinitis: ICD-10-CM

## 2018-04-14 DIAGNOSIS — G5601 Carpal tunnel syndrome, right upper limb: Secondary | ICD-10-CM

## 2018-04-15 DIAGNOSIS — Z0000 Encounter for general adult medical examination without abnormal findings: ICD-10-CM

## 2018-04-15 DIAGNOSIS — E559 Vitamin D deficiency, unspecified: ICD-10-CM

## 2018-04-15 DIAGNOSIS — G5622 Lesion of ulnar nerve, left upper limb: ICD-10-CM

## 2018-04-15 DIAGNOSIS — E785 Hyperlipidemia, unspecified: ICD-10-CM

## 2018-04-15 DIAGNOSIS — I2510 Atherosclerotic heart disease of native coronary artery without angina pectoris: ICD-10-CM

## 2018-06-03 ENCOUNTER — Encounter: Admit: 2018-06-03 | Discharge: 2018-06-03 | Payer: MEDICARE

## 2018-06-03 ENCOUNTER — Encounter: Admit: 2018-06-03 | Discharge: 2018-06-04 | Payer: MEDICARE

## 2018-06-03 DIAGNOSIS — C61 Malignant neoplasm of prostate: Secondary | ICD-10-CM

## 2018-06-03 DIAGNOSIS — Z889 Allergy status to unspecified drugs, medicaments and biological substances status: Secondary | ICD-10-CM

## 2018-06-03 DIAGNOSIS — N400 Enlarged prostate without lower urinary tract symptoms: Secondary | ICD-10-CM

## 2018-06-03 DIAGNOSIS — E785 Hyperlipidemia, unspecified: Secondary | ICD-10-CM

## 2018-06-03 DIAGNOSIS — I2510 Atherosclerotic heart disease of native coronary artery without angina pectoris: Secondary | ICD-10-CM

## 2018-06-03 DIAGNOSIS — I209 Angina pectoris, unspecified: Secondary | ICD-10-CM

## 2018-06-03 DIAGNOSIS — Z79899 Other long term (current) drug therapy: Secondary | ICD-10-CM

## 2018-06-03 DIAGNOSIS — R109 Unspecified abdominal pain: Secondary | ICD-10-CM

## 2018-06-03 DIAGNOSIS — N200 Calculus of kidney: Secondary | ICD-10-CM

## 2018-06-03 DIAGNOSIS — J302 Other seasonal allergic rhinitis: Secondary | ICD-10-CM

## 2018-06-03 DIAGNOSIS — I219 Acute myocardial infarction, unspecified: Secondary | ICD-10-CM

## 2018-06-03 DIAGNOSIS — I10 Essential (primary) hypertension: Secondary | ICD-10-CM

## 2018-06-03 DIAGNOSIS — R3129 Other microscopic hematuria: Secondary | ICD-10-CM

## 2018-06-03 DIAGNOSIS — M1990 Unspecified osteoarthritis, unspecified site: Secondary | ICD-10-CM

## 2018-06-03 LAB — LIPID PROFILE
Lab: 133 mg/dL — ABNORMAL HIGH (ref <100)
Lab: 141 mg/dL — ABNORMAL LOW (ref 35.0–45.0)
Lab: 148 mg/dL (ref <150)
Lab: 187 mg/dL (ref <200)
Lab: 30 mg/dL — ABNORMAL HIGH (ref <5.6)
Lab: 46 mg/dL (ref >40)

## 2018-06-03 LAB — PROSTATIC SPECIFIC ANTIGEN-PSA: Lab: 3.2 ng/mL (ref <6.01)

## 2018-07-07 ENCOUNTER — Encounter: Admit: 2018-07-07 | Discharge: 2018-07-07 | Payer: MEDICARE

## 2018-07-07 ENCOUNTER — Ambulatory Visit: Admit: 2018-07-07 | Discharge: 2018-07-08 | Payer: MEDICARE

## 2018-07-07 DIAGNOSIS — R3129 Other microscopic hematuria: ICD-10-CM

## 2018-07-07 DIAGNOSIS — I2510 Atherosclerotic heart disease of native coronary artery without angina pectoris: ICD-10-CM

## 2018-07-07 DIAGNOSIS — M1990 Unspecified osteoarthritis, unspecified site: ICD-10-CM

## 2018-07-07 DIAGNOSIS — I219 Acute myocardial infarction, unspecified: ICD-10-CM

## 2018-07-07 DIAGNOSIS — J302 Other seasonal allergic rhinitis: ICD-10-CM

## 2018-07-07 DIAGNOSIS — I10 Essential (primary) hypertension: ICD-10-CM

## 2018-07-07 DIAGNOSIS — N400 Enlarged prostate without lower urinary tract symptoms: ICD-10-CM

## 2018-07-07 DIAGNOSIS — I209 Angina pectoris, unspecified: Principal | ICD-10-CM

## 2018-07-07 DIAGNOSIS — N200 Calculus of kidney: ICD-10-CM

## 2018-07-07 DIAGNOSIS — E785 Hyperlipidemia, unspecified: ICD-10-CM

## 2018-07-07 DIAGNOSIS — M19041 Primary osteoarthritis, right hand: Principal | ICD-10-CM

## 2018-07-07 DIAGNOSIS — C61 Malignant neoplasm of prostate: ICD-10-CM

## 2018-07-07 DIAGNOSIS — R109 Unspecified abdominal pain: ICD-10-CM

## 2018-07-07 MED ORDER — ACETAMINOPHEN 650 MG PO TBER
650-1300 mg | Freq: Two times a day (BID) | ORAL | 0 refills | Status: AC | PRN
Start: 2018-07-07 — End: ?

## 2018-07-07 NOTE — Progress Notes
Date of Service: 07/07/2018    Timothy Sweeney is a 78 y.o. male   DOB: [birth year]     MRN#: [id number]    Subjective:    Timothy Sweeney is a 78 y.o. 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     History of Present Illness  Timothy Sweeney presents to clinic today for his multiple medical conditions as stated above. He reports that today his main concern is with continued pain in his hands. Her reports that every morning he wakes up with stiffness and pain in all of the joints of his hands. He does not take anything for the pain the morning and reports it takes about 1 hour for the stiffness to improve. Throughout the day it improves further with activity, but never back to what he would call normal. He notes this has decreased his ability to handle small objects like coins, but has not affected his ability to hold tools or keep working. He notes that the tylenol PM at night had no benefit thus he stopped taking on a regular basis.    He reports a brother with arthritis as well, but is unsure of further details other than it is bad and that he takes medications for it. He denies any other family history of arthritis or autoimmune conditions. He has occasional low back pain, but only if he tweaks his back. He reports other aches in his bilateral shoulder on occasion, his right knee, and muscle aches in his legs on occasion. He denies swelling or redness in any of these joints. He reports some dry mouth since starting finasteride, but otherwise denies dry eyes, rashes, or other skin lesions.     In addition to the arthritis he continues to deal with numbness & tingling from the compression on his median and ulnar nerves in his upper extremities. The right arm is worse than the left. He reports the night brace made symptoms worse and thus has stopped this. He is unsure if this has any relation to his arthritis. ??? MULTIVITAMIN PO Take 1 tablet by mouth daily.   ??? Niacin 500 mg cpER Take  by mouth.   ??? nitroglycerin (NITROSTAT) 0.4 mg tablet Place 0.4 mg under tongue every 5 minutes as needed.   ??? Omega-3 Acid Ethyl Esters 1 gram cap Take 1 g by mouth twice daily with meals.     Vitals:    07/07/18 1015   BP: 141/75   BP Source: Arm, Left Upper   Patient Position: Sitting   Pulse: 85   Resp: 12   Temp: 36.6 ???C (97.8 ???F)   TempSrc: Oral   SpO2: 100%   Weight: 80.3 kg (177 lb 1.6 oz)   Height: 185.4 cm (73)   PainSc: Zero     Body mass index is 23.37 kg/m???.     Physical Exam  Vitals signs reviewed.   Constitutional:       General: He is not in acute distress.     Appearance: Normal appearance. He is well-developed and normal weight. He is not diaphoretic.   HENT:      Head: Normocephalic and atraumatic.      Right Ear: External ear normal.      Left Ear: External ear normal.      Mouth/Throat:      Mouth: Mucous membranes are moist.      Pharynx: Oropharynx is  clear. No oropharyngeal exudate.   Eyes:      General: No scleral icterus.        Right eye: No discharge.         Left eye: No discharge.      Conjunctiva/sclera: Conjunctivae normal.   Neck:      Musculoskeletal: Normal range of motion and neck supple.   Cardiovascular:      Rate and Rhythm: Normal rate and regular rhythm.      Heart sounds: Normal heart sounds. No murmur. No friction rub. No gallop.    Pulmonary:      Effort: Pulmonary effort is normal. No respiratory distress.      Breath sounds: Normal breath sounds. No stridor. No wheezing or rales.   Abdominal:      General: There is no distension.      Palpations: Abdomen is not rigid.      Tenderness: Negative signs include Murphy's sign and McBurney's sign.      Hernia: There is no hernia in the ventral area.   Musculoskeletal:         General: No tenderness or deformity.      Right lower leg: No edema.      Left lower leg: No edema.      Comments: Decreased AROM of flexion at MCP bilateral hands No swan necking of bilateral hands  No focal erythema, tenderness, or swelling of bilateral DIPS, PIPS, or MCPs   Skin:     General: Skin is warm and dry.      Findings: No erythema or rash.   Neurological:      Mental Status: He is alert.      Sensory: No sensory deficit.      Comments: Sensation in b/l UEs intact   Psychiatric:         Behavior: Behavior normal.              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  - Discontinued gabapentin given GI distress  - EMG 01/2018: No cervical radiculopathy; right moderate median nerve entrapment at wrist, left mild ulnar nerve entrapment at elbow & median nerve entrapment at wrist   -FMHx: Brother with unspecified arthritis of hands on unspecified treatment  Plan:  > Continue tylenol PM qhs  > Start tylenol arthritis qam  > Did not tolerate wrist splint, but will trial again  > If symptoms persist will consider hand xrays and possible autoimmune workup depending on family history and/or possible injection for carpal tunnel    Health Care Maintenance  > Continue Vitamin D  > colonoscopy due 03/2020  > Instructed to obtain RZV & Tdap at local pharmacy or county health department  > Pneumovax 23 valent today    Hyperlipidemia  Lab Results   Component Value Date    CHOL 187 06/03/2018    TRIG 148 06/03/2018    HDL 46 06/03/2018    LDL 133 (H) 06/03/2018    VLDL 30 06/03/2018  The 10-year ASCVD risk score Denman George DC Montez Hageman., et al., 2013) is: 32.9%    Values used to calculate the score: Age: 79 years      Sex: Male      Is Non-Hispanic African American: No      Diabetic: No      Tobacco smoker: No      Systolic Blood Pressure: 141 mmHg      Is BP treated: Yes      HDL Cholesterol: 46 MG/DL      Total Cholesterol: 187 MG/DL  - Current regimen: clesevelam 1875mg  bid, omega-3 1gm bid, ezetimibe 10mg  qday, & niacin 500mg  qday   - Intolerance to statins (myopathy & elevated LFTs)  Plan:  > Continue current regimen  > Per patient Dr. Janyth Contes [facility] Cardiologist) is considering Repatha  ???  Hypertension  Lightheadedness/dizziness  BP Readings from Last 5 Encounters:   07/07/18 141/75   06/03/18 117/77   04/14/18 114/75   01/28/18 116/76   01/20/18 133/83   Current regimen: Metoprolol tartrate 25mg  bid  -Reports that when his blood pressure is less than 110 he feels lightheaded & dizzy, denies falls  -Stopped taking his metoprolol on his own due to recurrently low BP with symptoms  Plan:  > Instructed patient to notify changes of his metoprolol to Dr. Janyth Contes  > Instructed patient to trial taking 25mg  metoprolol once daily and if still having symptoms consider taking 12.5mg  daily  ???  Prostate cancer (HCC)  BPH  - Incidental finding on CT A/P at OSH  - PSA elevated > 6 & imaging on MRI with multiple nodules seen  - Current regimen: Finasteride 5mg  qdayt  - Nocturia improved greatly, no further frequent lightheaded spells  Plan:  > Continue finasteride  > Continue to hold alpha blockers in setting of orthostasis   ?????? ???  CAD (coronary artery disease)  - Follows with Dr. Janyth Contes  - History of x4 PCI with x10 stents placed  - Status post 5V CABG 07/2017               - Completed cardiac rehab  - Current regimen: ASA 81mg  qday; plavix 75mg  qday; niacin 500mg  qday; ezetimibe 10mg  qday; colesevelam 1875mg  BID; omega 3 bid; metoprolol tartrate 25mg  bid  Plan:  > Continue current regimen  > Keep f/u with Dr. Janyth Contes in Spring 2020        Return in about 3 months (around 10/05/2018).    Patient discussed with Dr. Charna Archer. Rosetta Posner, DO  Internal Medicine, PGY-3  Pager: [phone]  Available on Vo[employer]te & Cure[employer]    Patient Instructions   Specific Instructions:    - Trial taking your metoprolol 25mg  once daily in the morning or at night and monitor blood pressure and symptoms. If having symptoms (lightheadedness, dizziness, falls) trial 12.5mg  (half a pill) once daily. If this is still causing symptoms stop medication and contact our office & your cardiologist Dr. Elvera Lennox     Hold metoprolol if you blood pressure is < 110 on the top AND/OR < 60 on the bottom AND/OR if heart rate is < 50.      - Start taking two Tylenol Arthritis extended-release tablets (650 milligrams each) every 12 hours as needed for arthritis pain.    OR    - Start taking two Tylenol Extra Strength pills (500 milligrams each) every 8 hours as needed for pain.     OR    - Start taking two Tylenol Regular Strength pills (  325 milligrams each) every 6 hours as needed for pain.    - The active ingredient in Tylenol is acetaminophen which can commonly be added other medications including over the counter cold & flu medications or pain pills, please read all labels to ensure you are not taking excess acetaminophen.     - It is important not to exceed more than 3000 milligrams of acetaminophen in a 24 hour period.      General Instructions:  ??? Contact:   Please send a MyChart message to the General Medicine clinic or call our clinic nurse at [phone].  ??? MyChart: You may sign up for MyChart to receive test results, contact our team with questions, or have prescription requests filled.   ??? Use the following link to signup: [email or web address]  ??? Medication refills:  Please use the MyChart Refill request or contact your pharmacy directly to request medication refills.  Please allow 72 hours.  ??? Test results:  You will receive your test results in person at your appointment, or by MyChart if you are signed up. We prefer to discuss test

## 2018-07-08 DIAGNOSIS — E785 Hyperlipidemia, unspecified: ICD-10-CM

## 2018-07-08 DIAGNOSIS — G5622 Lesion of ulnar nerve, left upper limb: ICD-10-CM

## 2018-07-08 DIAGNOSIS — I10 Essential (primary) hypertension: ICD-10-CM

## 2018-07-08 DIAGNOSIS — M19042 Primary osteoarthritis, left hand: ICD-10-CM

## 2018-07-08 DIAGNOSIS — R42 Dizziness and giddiness: ICD-10-CM

## 2018-07-08 DIAGNOSIS — Z23 Encounter for immunization: ICD-10-CM

## 2018-07-08 DIAGNOSIS — G5601 Carpal tunnel syndrome, right upper limb: ICD-10-CM

## 2018-07-08 DIAGNOSIS — I2510 Atherosclerotic heart disease of native coronary artery without angina pectoris: ICD-10-CM

## 2018-07-08 DIAGNOSIS — Z0000 Encounter for general adult medical examination without abnormal findings: ICD-10-CM

## 2018-07-08 DIAGNOSIS — E559 Vitamin D deficiency, unspecified: Secondary | ICD-10-CM

## 2018-07-12 ENCOUNTER — Encounter: Admit: 2018-07-12 | Discharge: 2018-07-12 | Payer: MEDICARE

## 2018-07-12 DIAGNOSIS — N200 Calculus of kidney: ICD-10-CM

## 2018-07-12 DIAGNOSIS — I209 Angina pectoris, unspecified: Principal | ICD-10-CM

## 2018-07-12 DIAGNOSIS — R109 Unspecified abdominal pain: ICD-10-CM

## 2018-07-12 DIAGNOSIS — I10 Essential (primary) hypertension: ICD-10-CM

## 2018-07-12 DIAGNOSIS — J302 Other seasonal allergic rhinitis: ICD-10-CM

## 2018-07-12 DIAGNOSIS — I2510 Atherosclerotic heart disease of native coronary artery without angina pectoris: ICD-10-CM

## 2018-07-12 DIAGNOSIS — M1990 Unspecified osteoarthritis, unspecified site: ICD-10-CM

## 2018-07-12 DIAGNOSIS — I219 Acute myocardial infarction, unspecified: ICD-10-CM

## 2018-07-12 DIAGNOSIS — R3129 Other microscopic hematuria: ICD-10-CM

## 2018-07-12 DIAGNOSIS — C61 Malignant neoplasm of prostate: ICD-10-CM

## 2018-07-12 DIAGNOSIS — N400 Enlarged prostate without lower urinary tract symptoms: ICD-10-CM

## 2018-07-12 DIAGNOSIS — E785 Hyperlipidemia, unspecified: ICD-10-CM

## 2018-07-13 NOTE — Progress Notes
I performed a history and brief physical examination of Mr. Nembhard  and discussed the management with Dr. Rosetta Posner, DO at the time of the visit.  I agree with the evaluation and plan as documented by this resident, unless noted below, or in the resident's note in bold, blue italics.        Encounter Diagnoses   Name Primary?   ??? Arthritis of both hands Yes   ??? Need for pneumococcal vaccination    ??? Primary osteoarthritis of both hands    ??? Hyperlipidemia, unspecified hyperlipidemia type    ??? Essential hypertension    ??? Coronary artery disease involving native heart without angina pectoris, unspecified vessel or lesion type    ??? Vitamin D deficiency    ??? Right carpal tunnel syndrome    ??? Cubital tunnel syndrome on left    ??? Health care maintenance    ??? Episodic lightheadedness         Orders Placed This Encounter   ??? PNEUMOCOCCAL VACCINE 23-VAL   ??? acetaminophen SR (TYLENOL ARTHRITIS PAIN) 650 mg tablet     Return in about 3 months (around 10/05/2018).    Arther Abbott, MD

## 2018-07-23 ENCOUNTER — Encounter: Admit: 2018-07-23 | Discharge: 2018-07-23 | Payer: MEDICARE

## 2018-07-23 NOTE — Telephone Encounter
Pt. Called c/o blood in stool x 5 days. He states that it is bright red. He denies any SOB, CP, dizziness/lightheadedness, pain or other sxs. He states that he has been told in the past that he has hemorrhoids but hasn't looked recently. Transferred him to scheduling to make a same day/urgent care appt. And he verbalized understanding.  Lynnae Sandhoff, RN

## 2018-07-24 ENCOUNTER — Encounter: Admit: 2018-07-24 | Discharge: 2018-07-24 | Payer: MEDICARE

## 2018-07-24 ENCOUNTER — Ambulatory Visit: Admit: 2018-07-24 | Discharge: 2018-07-24 | Payer: MEDICARE

## 2018-07-24 DIAGNOSIS — I2510 Atherosclerotic heart disease of native coronary artery without angina pectoris: ICD-10-CM

## 2018-07-24 DIAGNOSIS — I219 Acute myocardial infarction, unspecified: ICD-10-CM

## 2018-07-24 DIAGNOSIS — J302 Other seasonal allergic rhinitis: ICD-10-CM

## 2018-07-24 DIAGNOSIS — M1990 Unspecified osteoarthritis, unspecified site: ICD-10-CM

## 2018-07-24 DIAGNOSIS — N200 Calculus of kidney: ICD-10-CM

## 2018-07-24 DIAGNOSIS — I10 Essential (primary) hypertension: ICD-10-CM

## 2018-07-24 DIAGNOSIS — N400 Enlarged prostate without lower urinary tract symptoms: ICD-10-CM

## 2018-07-24 DIAGNOSIS — K625 Hemorrhage of anus and rectum: Principal | ICD-10-CM

## 2018-07-24 DIAGNOSIS — E785 Hyperlipidemia, unspecified: ICD-10-CM

## 2018-07-24 DIAGNOSIS — I209 Angina pectoris, unspecified: Principal | ICD-10-CM

## 2018-07-24 DIAGNOSIS — R3129 Other microscopic hematuria: ICD-10-CM

## 2018-07-24 DIAGNOSIS — R109 Unspecified abdominal pain: ICD-10-CM

## 2018-07-24 DIAGNOSIS — C61 Malignant neoplasm of prostate: ICD-10-CM

## 2018-07-24 LAB — CBC
Lab: 15 g/dL (ref 13.5–16.5)
Lab: 4.8 10*3/uL (ref 4.5–11.0)
Lab: 4.9 M/UL (ref 4.4–5.5)
Lab: 45 % (ref 40–50)
Lab: 92 FL (ref 80–100)

## 2018-07-28 ENCOUNTER — Encounter: Admit: 2018-07-28 | Discharge: 2018-07-28 | Payer: MEDICARE

## 2018-07-28 DIAGNOSIS — N200 Calculus of kidney: ICD-10-CM

## 2018-07-28 DIAGNOSIS — M1990 Unspecified osteoarthritis, unspecified site: ICD-10-CM

## 2018-07-28 DIAGNOSIS — R3129 Other microscopic hematuria: ICD-10-CM

## 2018-07-28 DIAGNOSIS — I2510 Atherosclerotic heart disease of native coronary artery without angina pectoris: ICD-10-CM

## 2018-07-28 DIAGNOSIS — N400 Enlarged prostate without lower urinary tract symptoms: ICD-10-CM

## 2018-07-28 DIAGNOSIS — E785 Hyperlipidemia, unspecified: ICD-10-CM

## 2018-07-28 DIAGNOSIS — J302 Other seasonal allergic rhinitis: ICD-10-CM

## 2018-07-28 DIAGNOSIS — I10 Essential (primary) hypertension: ICD-10-CM

## 2018-07-28 DIAGNOSIS — I209 Angina pectoris, unspecified: Principal | ICD-10-CM

## 2018-07-28 DIAGNOSIS — R109 Unspecified abdominal pain: ICD-10-CM

## 2018-07-28 DIAGNOSIS — I219 Acute myocardial infarction, unspecified: ICD-10-CM

## 2018-07-28 DIAGNOSIS — C61 Malignant neoplasm of prostate: ICD-10-CM

## 2018-10-02 ENCOUNTER — Encounter: Admit: 2018-10-02 | Discharge: 2018-10-02 | Payer: MEDICARE

## 2018-10-02 MED ORDER — FINASTERIDE 5 MG PO TAB
ORAL_TABLET | Freq: Every day | 1 refills | Status: DC
Start: 2018-10-02 — End: 2019-03-13

## 2018-10-13 ENCOUNTER — Encounter: Admit: 2018-10-13 | Discharge: 2018-10-13 | Payer: MEDICARE

## 2018-10-30 ENCOUNTER — Ambulatory Visit: Admit: 2018-10-30 | Discharge: 2018-10-30

## 2018-10-30 ENCOUNTER — Encounter: Admit: 2018-10-30 | Discharge: 2018-10-30

## 2018-11-16 ENCOUNTER — Encounter: Admit: 2018-11-16 | Discharge: 2018-11-16

## 2018-11-16 NOTE — Telephone Encounter
Returned pt call and let him know it was ok to take that order to any facility close to him have labs drawn. I also made pt aware of appt change from 7/8 to 7/22 with jenny quinn and he was ok with that.

## 2018-11-25 ENCOUNTER — Encounter: Admit: 2018-11-25 | Discharge: 2018-11-25

## 2018-11-25 DIAGNOSIS — C61 Malignant neoplasm of prostate: Secondary | ICD-10-CM

## 2018-11-25 LAB — PROSTATIC SPECIFIC ANTIGEN-PSA: Lab: 3.2 ng/mL (ref <6.01)

## 2018-12-08 ENCOUNTER — Encounter: Admit: 2018-12-08 | Discharge: 2018-12-08

## 2018-12-09 ENCOUNTER — Encounter: Admit: 2018-12-09 | Discharge: 2018-12-09

## 2018-12-09 DIAGNOSIS — J302 Other seasonal allergic rhinitis: Secondary | ICD-10-CM

## 2018-12-09 DIAGNOSIS — E785 Hyperlipidemia, unspecified: Secondary | ICD-10-CM

## 2018-12-09 DIAGNOSIS — N400 Enlarged prostate without lower urinary tract symptoms: Secondary | ICD-10-CM

## 2018-12-09 DIAGNOSIS — M1990 Unspecified osteoarthritis, unspecified site: Secondary | ICD-10-CM

## 2018-12-09 DIAGNOSIS — R3129 Other microscopic hematuria: Secondary | ICD-10-CM

## 2018-12-09 DIAGNOSIS — I219 Acute myocardial infarction, unspecified: Secondary | ICD-10-CM

## 2018-12-09 DIAGNOSIS — R109 Unspecified abdominal pain: Secondary | ICD-10-CM

## 2018-12-09 DIAGNOSIS — C61 Malignant neoplasm of prostate: Secondary | ICD-10-CM

## 2018-12-09 DIAGNOSIS — I209 Angina pectoris, unspecified: Secondary | ICD-10-CM

## 2018-12-09 DIAGNOSIS — N200 Calculus of kidney: Secondary | ICD-10-CM

## 2018-12-09 DIAGNOSIS — I10 Essential (primary) hypertension: Secondary | ICD-10-CM

## 2018-12-09 DIAGNOSIS — I2510 Atherosclerotic heart disease of native coronary artery without angina pectoris: Secondary | ICD-10-CM

## 2018-12-10 NOTE — Progress Notes
Date of Service: 12/09/2018     Subjective:             Timothy Sweeney is a 78 y.o. male.    Chief Complaint   Patient presents with   ??? Prostate Cancer       Cancer Staging  No matching staging information was found for the patient.    History of Present Illness  Mr. Timothy Sweeney is a very pleasant 78 year old male, with a history of prostate cancer, diagnosed in June 2018, he had grade Group 1 disease.  In the 18 months following his biopsy, he had significant cardiac event that required a bypass in March 2019.  At his last visit in June 2019, he was having issues with orthostatic hypotension.  He has stopped the Flomax and is voiding well without any urinary complaints.    He had a prostate biopsy performed on 11/08/2016, pathology revealed 1 of 14 cores positive for prostatic adenocarcinoma, Gleason score 3+3 equal 6, grade group 1 disease.  He had an MRI of the pelvis performed on 09/27/2016, MRI revealed 2 PI-RADS 4 lesions, prostate volume of 64 mL.    He does state on 10/14/2017, he did have 3 episodes of passing out.  He was taken to [facility] care, he was then transported to [facility] where his cardiologist was.  They could not figure out what was wrong with him.  He did have a device placed that is under his skin on his left chest, he then can place a marker if he does not feel good and record his heart.  He does have a machine at night that does monitor his heart rate and rhythm.  He is not for sure of a specific name for his monitoring.    He denies any urgency, states he urinates every 2 hours during the day.  He does have nocturia x1.  He feels that he empties his bladder after urination.  He denies any hematuria or recent infections.  He describes his stream is a moderate full stream.  He denies any hesitancy, he does have occasional intermittent break in his stream.    He does have erectile dysfunction, he is not interested in any intervention at this time.           Review of Systems Constitutional: Negative for activity change, appetite change, chills, diaphoresis, fatigue, fever and unexpected weight change.   HENT: Negative for congestion, hearing loss, mouth sores, rhinorrhea, sinus pressure, sore throat and trouble swallowing.    Eyes: Negative for discharge and visual disturbance.   Respiratory: Negative for apnea, cough, chest tightness, shortness of breath and wheezing.    Cardiovascular: Negative for chest pain, palpitations and leg swelling.   Gastrointestinal: Negative for abdominal pain, blood in stool, constipation, diarrhea, nausea, rectal pain and vomiting.   Genitourinary: Negative for decreased urine volume, difficulty urinating, discharge, dysuria, enuresis, flank pain, frequency, genital sores, hematuria, penile pain, penile swelling, scrotal swelling, testicular pain and urgency.   Musculoskeletal: Negative for arthralgias, back pain, gait problem and myalgias.   Skin: Negative for rash and wound.   Neurological: Negative for dizziness, tremors, seizures, syncope, weakness, light-headedness, numbness and headaches.   Hematological: Negative for adenopathy. Does not bruise/bleed easily.   Psychiatric/Behavioral: Negative for agitation, behavioral problems, decreased concentration, dysphoric mood and sleep disturbance. The patient is not nervous/anxious.        Objective:         ??? acetaminophen SR (TYLENOL ARTHRITIS PAIN) 650  mg tablet Take one tablet to two tablets by mouth every 12 hours as needed for Pain.   ??? Arm Brace misc Apply to right wrist nightly to keep wrist in neutral postion.   ??? aspirin EC 81 mg tablet Take 81 mg by mouth daily.   ??? cholecalciferol (VITAMIN D-3) 1,000 units tablet Take one tablet by mouth daily. Indications: vitamin D deficiency   ??? clopiDOGrel (PLAVIX) 75 mg tablet Take 75 mg by mouth daily.   ??? colesevelam(+) (WELCHOL) 625 mg tablet Take 1,875 mg by mouth twice daily with meals. ??? evolocumab (REPATHA) 140 mg/mL injectable SYRINGE Inject 140 mg under the skin every 14 days.   ??? ezetimibe (ZETIA) 10 mg tablet Take 10 mg by mouth daily.   ??? finasteride (PROSCAR) 5 mg tablet TAKE 1 TABLET BY MOUTH  DAILY   ??? metoprolol tartrate (LOPRESSOR) 50 mg tablet Take 25 mg by mouth twice daily.   ??? MULTIVITAMIN PO Take 1 tablet by mouth daily.   ??? Niacin 500 mg cpER Take  by mouth.   ??? nitroglycerin (NITROSTAT) 0.4 mg tablet Place 0.4 mg under tongue every 5 minutes as needed.   ??? Omega-3 Acid Ethyl Esters 1 gram cap Take 1 g by mouth twice daily with meals.   ??? senna/docusate (STOOL SOFTENER-STIMULANT LAXAT) 8.6/50 mg tablet Take 50 tablets by mouth daily. 50 mg tablet       Vitals:    12/09/18 1458   BP: (!) 140/82   BP Source: Arm, Left Upper   Patient Position: Sitting   Pulse: 83   Resp: 16   Temp: 36.9 ???C (98.5 ???F)   TempSrc: Oral   SpO2: 97%   Weight: 80.2 kg (176 lb 12.8 oz)   Height: 185.4 cm (72.99)   PainSc: Two       Body mass index is 23.33 kg/m???.     Labs:  [facility] PSA Hx:  Lab Results   Component Value Date    PSA 3.20 11/25/2018    PSA 3.20 06/03/2018    PSA 5.92 11/05/2017    PSA 7.55 (H) 05/07/2017    PSA 6.97 (H) 09/11/2016    PSA 6.90 (H) 07/10/2016       Physical Exam  Vitals signs reviewed.   Constitutional:       General: He is not in acute distress.     Appearance: Normal appearance. He is not ill-appearing.   HENT:      Head: Normocephalic and atraumatic.   Eyes:      General: No scleral icterus.     Extraocular Movements: Extraocular movements intact.      Conjunctiva/sclera: Conjunctivae normal.   Neck:      Musculoskeletal: Normal range of motion.   Pulmonary:      Effort: Pulmonary effort is normal. No respiratory distress.   Abdominal:      General: There is no distension.      Palpations: Abdomen is soft.   Genitourinary:     Comments: DRE performed, ~40-50 gram prostate, no nodules, tenderness or firmness noted  Corbin Ade, MA present for the exam Musculoskeletal: Normal range of motion.         General: No swelling.   Skin:     General: Skin is warm and dry.   Neurological:      Mental Status: He is alert and oriented to person, place, and time.   Psychiatric:         Mood and Affect: Mood normal.  Behavior: Behavior normal.         Thought Content: Thought content normal.         Judgment: Judgment normal.             Assessment and Plan:  Problem   Prostate Cancer (Hcc)    07/10/2016 PSA 6.9  09/11/2016 PSA 6.97  09/27/2016 - MRI prostate - results as follows:  1. No large focal lesion to suggest large volume, high-grade disease.  2. Several subcentimeter peripheral zone nodules, the largest of which   within the posterior left mid gland demonstrates marked restricted   diffusion and enhancement, most suggestive of small volume, high-grade   disease (PI-RADS 4). ???This is contoured in the Dynacad system as lesion 1.   Additional peripheral zone lesions are equivocal for high-grade disease   (PI-RADS 3).  3. Small ill-defined nodule in the left lateral prostate base, which is   indeterminate between a peripheral zone lesion or extruded transition zone   nodule and should be considered for high-grade disease (PI-RADS 4). This   is contoured in the Dynacad system as lesion 2.  4. Moderate nodular hyperplasia with several extruded nodules arising from   the transition zone (PI-RADS 2).    11/08/2016:  Targeted prostate biopsy:    One core showed Gleason grade 3+3=6 adenocarcinoma of the prostate involving 5% of needle core and measuring 1 mm in length    Labs:  [place] PSA Hx:  Lab Results   Component Value Date    PSA 3.20 11/25/2018    PSA 3.20 06/03/2018    PSA 5.92 11/05/2017    PSA 7.55 (H) 05/07/2017    PSA 6.97 (H) 09/11/2016    PSA 6.90 (H) 07/10/2016            Prostate cancer (HCC)  I had the pleasure of visiting with Mr. Culbreth in clinic today.  He returns for follow-up regarding his low risk prostate cancer, he has been on active surveillance.  He has great urinary control, no new urinary complaints.  In the 18 months following his biopsy on 11/08/16, he has significant cardiac events that required a bypass surgery in March 2019.  He has not had a confirmatory biopsy. With his PSA being stable, it is recommended to return in 6 months with another PSA, we will continue with active surveillance. He is agreeable to this plan.    He no longer takes his Flomax 0.4 mg, he was experiencing orthostatic hypotension.  Most recent PSA was drawn on 11/25/2018, PSA was 3.20 ng/mL, stable value, same as previous value.    He does have erectile dysfunction, this is not bothersome for him at this time.  He does not wish for any interventions at this time.    Plan:  1.  Return to clinic in 6 months with a PSA and he will see Dr. Jimmey Ralph  2.  He has not had a confirmatory biopsy due to cardiac issues  3.  Most recent MRI was performed on 10/07/2016, MRI revealed 2 PI-RADS 4 lesions    Orders Placed This Encounter   ??? PROSTATIC SPECIFIC ANTIGEN-PSA       San Jetty, APRN-NP  Urology

## 2018-12-10 NOTE — Assessment & Plan Note
I had the pleasure of visiting with Timothy Sweeney in clinic today.  He returns for follow-up regarding his low risk prostate cancer, he has been on active surveillance.  He has great urinary control, no new urinary complaints.  In the 18 months following his biopsy on 11/08/16, he has significant cardiac events that required a bypass surgery in March 2019.  He has not had a confirmatory biopsy. With his PSA being stable, it is recommended to return in 6 months with another PSA, we will continue with active surveillance. He is agreeable to this plan.    He no longer takes his Flomax 0.4 mg, he was experiencing orthostatic hypotension.  Most recent PSA was drawn on 11/25/2018, PSA was 3.20 ng/mL, stable value, same as previous value.    He does have erectile dysfunction, this is not bothersome for him at this time.  He does not wish for any interventions at this time.    Plan:  1.  Return to clinic in 6 months with a PSA and he will see Dr. Jerline Pain  2.  He has not had a confirmatory biopsy due to cardiac issues  3.  Most recent MRI was performed on 10/07/2016, MRI revealed 2 PI-RADS 4 lesions

## 2018-12-15 ENCOUNTER — Encounter: Admit: 2018-12-15 | Discharge: 2018-12-15

## 2018-12-15 NOTE — Telephone Encounter
I called Timothy Sweeney, as I consulted with Dr. Jerline Pain that I had him returning in 6 months with a PSA.  Dr. Jerline Pain wanted to make sure that he was stable from a cardiac standpoint before performing a confirmatory biopsy.  Timothy Sweeney would like to wait until after he sees Dr. Jerline Pain in 6 months with another PSA and make a decision after that. He did not have any further questions.

## 2019-01-06 ENCOUNTER — Encounter: Admit: 2019-01-06 | Discharge: 2019-01-06

## 2019-01-06 DIAGNOSIS — J302 Other seasonal allergic rhinitis: Secondary | ICD-10-CM

## 2019-01-06 DIAGNOSIS — N400 Enlarged prostate without lower urinary tract symptoms: Secondary | ICD-10-CM

## 2019-01-06 DIAGNOSIS — M1990 Unspecified osteoarthritis, unspecified site: Secondary | ICD-10-CM

## 2019-01-06 DIAGNOSIS — I10 Essential (primary) hypertension: Secondary | ICD-10-CM

## 2019-01-06 DIAGNOSIS — R109 Unspecified abdominal pain: Secondary | ICD-10-CM

## 2019-01-06 DIAGNOSIS — M5412 Radiculopathy, cervical region: Secondary | ICD-10-CM

## 2019-01-06 DIAGNOSIS — Z7689 Persons encountering health services in other specified circumstances: Secondary | ICD-10-CM

## 2019-01-06 DIAGNOSIS — E785 Hyperlipidemia, unspecified: Secondary | ICD-10-CM

## 2019-01-06 DIAGNOSIS — N200 Calculus of kidney: Secondary | ICD-10-CM

## 2019-01-06 DIAGNOSIS — I209 Angina pectoris, unspecified: Secondary | ICD-10-CM

## 2019-01-06 DIAGNOSIS — C61 Malignant neoplasm of prostate: Secondary | ICD-10-CM

## 2019-01-06 DIAGNOSIS — R3129 Other microscopic hematuria: Secondary | ICD-10-CM

## 2019-01-06 DIAGNOSIS — I2510 Atherosclerotic heart disease of native coronary artery without angina pectoris: Secondary | ICD-10-CM

## 2019-01-06 DIAGNOSIS — I219 Acute myocardial infarction, unspecified: Secondary | ICD-10-CM

## 2019-01-06 MED ORDER — MELOXICAM 15 MG PO TAB
15 mg | ORAL_TABLET | Freq: Every day | ORAL | 1 refills | 30.00000 days | Status: DC
Start: 2019-01-06 — End: 2019-02-26

## 2019-01-06 NOTE — Progress Notes
Chief Complaint   Patient presents with   ??? Establish Care        HPI   Patient presents to clinic to establish care.  He states overall he is doing well.  He is concerned about osteoarthritic changes in his hands.  He has tried Tylenol and ibuprofen in the past without much relief.  Patient also has ongoing right arm numbness and has seen neurology in the past and been worked up for carpal tunnel.  He states his carpal tunnel test was normal.  He continues to have some numbness in his arm.  He has no other concerns today.      Review of Systems   Constitution: Negative for fever and malaise/fatigue.   HENT: Negative for congestion.    Eyes: Negative for blurred vision.   Cardiovascular: Negative for chest pain and dyspnea on exertion.   Respiratory: Negative for cough and shortness of breath.    Skin: Negative for rash.   Musculoskeletal: Positive for arthritis, joint pain and joint swelling. Negative for back pain.   Gastrointestinal: Negative for abdominal pain, constipation, diarrhea, heartburn and nausea.   Neurological: Positive for numbness.   Psychiatric/Behavioral: Negative for depression. The patient is not nervous/anxious.          Medical History:   Diagnosis Date   ??? Angina pectoris (HCC)    ??? Arthritis    ??? CAD (coronary artery disease) 07/10/2016   ??? Enlarged prostate     by imaging   ??? Flank pain, right 07/10/2016   ??? Hyperlipidemia    ??? Hypertension    ??? Kidney N[place]oda Chock     2 non-obstructing stones on imaging   ??? Microhematuria 07/10/2016    -- tested in Urologist's office   ??? 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 Prostate cancer [facility]) 05/07/2017    07/10/2016 PSA 6.9 09/11/2016 PSA 6.97 09/27/2016 - MRI prostate - results as follows: 1. No large focal lesion to suggest large volume, high-grade disease. 2. Several subcentimeter peripheral zone nodules, the largest of which  within the posterior left mid gland demonstrates marked restricted diffusion and enhancement, most suggestive of small volume, high-grade  disease (PI-RADS 4). ???This is cont   ??? Seasonal allergic reaction      Surgical History:   Procedure Laterality Date   ??? HERNIA REPAIR Bilateral    ??? PERCUTANEOUS CORONARY INTERVENTION      x4 with x10 stents     Family History   Problem Relation Age of Onset   ??? Anemia Mother    ??? Alzheimer's Mother    ??? Cancer Father         MDS   ??? Stroke Brother    ??? Heart Disease Paternal Uncle      Social History     Socioeconomic History   ??? Marital status: Married     Spouse name: Not on file   ??? Number of children: 5   ??? Years of education: Not on file   ??? Highest education level: Not on file   Occupational History     Employer: [employer]   Tobacco Use   ??? Smoking status: Never Smoker   ??? Smokeless tobacco: Never Used   Substance and Sexual Activity   ??? Alcohol use: No   ??? Drug use: No   ??? Sexual activity: Not on file   Other Topics Concern   ??? Not on file  Social History Narrative   ??? Not on file          Your Current Medications:       Instructions    acetaminophen SR (TYLENOL ARTHRITIS PAIN) 650 mg tablet Take one tablet to two tablets by mouth every 12 hours as needed for Pain.    aspirin EC 81 mg tablet Take 81 mg by mouth daily.    cholecalciferol (VITAMIN D-3) 1,000 units tablet Take one tablet by mouth daily. Indications: vitamin D deficiency    clopiDOGrel (PLAVIX) 75 mg tablet Take 75 mg by mouth daily.    evolocumab (REPATHA) 140 mg/mL injectable SYRINGE Inject 140 mg under the skin every 14 days.    ezetimibe (ZETIA) 10 mg tablet Take 10 mg by mouth daily.    finasteride (PROSCAR) 5 mg tablet TAKE 1 TABLET BY MOUTH  DAILY    meloxicam (MOBIC) 15 mg tablet Take one tablet by mouth daily.    metoprolol tartrate (LOPRESSOR) 50 mg tablet Take 25 mg by mouth twice daily.    MULTIVITAMIN PO Take 1 tablet by mouth daily.    Niacin 500 mg cpER Take  by mouth.    nitroglycerin (NITROSTAT) 0.4 mg tablet Place 0.4 mg under tongue every 5 minutes as needed.    Omega-3 Acid Ethyl Esters 1 gram cap Take 1 g by mouth twice daily with meals.    senna/docusate (STOOL SOFTENER-STIMULANT LAXAT) 8.6/50 mg tablet Take 50 tablets by mouth daily. 50 mg tablet          Vitals:    01/06/19 1103   BP: (!) 145/77   Pulse: 69   Resp: 18   Temp: (!) 35.7 ???C (96.3 ???F)   SpO2: 99%        Physical Exam  Gen: NAD, A&Ox3  Eyes:  EOMI  CV: RRR no murmur  Pulm: CTA bil  M/S: full ROM all 4 extremities, normal gait, OA changes in hands bilaterally  Ext: no c/c/e  Neuro: CN II-XII intact  Psych: normal mood and affect      Basic Metabolic Profile    Lab Results   Component Value Date/Time    NA 138 01/28/2018 10:08 AM    K 4.8 01/28/2018 10:08 AM    CA 9.6 01/28/2018 10:08 AM    CL 106 01/28/2018 10:08 AM    CO2 27 01/28/2018 10:08 AM    GAP 5 01/28/2018 10:08 AM    Lab Results   Component Value Date/Time    BUN 14 01/28/2018 10:08 AM    CR 1.02 01/28/2018 10:08 AM    GLU 109 (H) 01/28/2018 10:08 AM         CBC w diff    Lab Results   Component Value Date/Time    WBC 4.8 07/24/2018 09:52 AM    RBC 4.93 07/24/2018 09:52 AM    HGB 15.3 07/24/2018 09:52 AM    HCT 45.4 07/24/2018 09:52 AM    MCV 92.1 07/24/2018 09:52 AM    MCH 31.1 07/24/2018 09:52 AM    MCHC 33.7 07/24/2018 09:52 AM    RDW 14.4 07/24/2018 09:52 AM    PLTCT 196 07/24/2018 09:52 AM    MPV 8.5 07/24/2018 09:52 AM    Lab Results   Component Value Date/Time    NEUT 61 01/28/2018 10:08 AM    ANC 3.50 01/28/2018 10:08 AM    LYMA 29 01/28/2018 10:08 AM    ALC 1.60 01/28/2018 10:08 AM    MONA 8  01/28/2018 10:08 AM    AMC 0.50 01/28/2018 10:08 AM    EOSA 1 01/28/2018 10:08 AM    AEC 0.10 01/28/2018 10:08 AM    BASA 1 01/28/2018 10:08 AM    ABC 0.10 01/28/2018 10:08 AM         Thyroid Studies    No results found for: TSH, FREET4, FREEINDEX No results found for: FREET3, T3BINDING, THYBINDGLB           1. Encounter to establish care  Patient to return to clinic for annual physical exam and Medicare wellness visit 2. Primary osteoarthritis of both hands  Start meloxicam daily to see if this improves patient's symptoms.  He is to call if it does improves his symptoms and we will continue.    3. Cervical radiculopathy  Meloxicam may improve the symptoms.  Already ruled out carpal tunnel syndrome.  If continues to the patient may need to consider anesthesia referral                   Monitored today and my assessment is that the current treatment is appropriate.     I reviewed with the patient their current medications and specifically any new medications prescribed at the time of this visit and we reviewed the expected benefits and potential side effects. All questions are answered to the patient's satisfaction.

## 2019-01-06 NOTE — Patient Instructions
Take meloxicam daily for 2 weeks and then daily as needed for joint pain and numbness.  Please call if you have heartburn or dark/bloody stools    If your numbness persists after one month, please call and will add gabapentin nightly.    Please return to clinic for annual Medicare wellness visit

## 2019-01-07 ENCOUNTER — Ambulatory Visit: Admit: 2019-01-06 | Discharge: 2019-01-07

## 2019-01-07 DIAGNOSIS — M19042 Primary osteoarthritis, left hand: Secondary | ICD-10-CM

## 2019-01-07 DIAGNOSIS — M19041 Primary osteoarthritis, right hand: Secondary | ICD-10-CM

## 2019-02-26 ENCOUNTER — Encounter: Admit: 2019-02-26 | Discharge: 2019-02-26 | Payer: MEDICARE

## 2019-02-26 MED ORDER — MELOXICAM 15 MG PO TAB
ORAL_TABLET | Freq: Every day | ORAL | 1 refills | 30.00000 days | Status: DC
Start: 2019-02-26 — End: 2019-07-01

## 2019-03-10 ENCOUNTER — Encounter: Admit: 2019-03-10 | Discharge: 2019-03-10 | Payer: MEDICARE

## 2019-03-13 MED ORDER — FINASTERIDE 5 MG PO TAB
ORAL_TABLET | Freq: Every day | ORAL | 1 refills | 90.00000 days | Status: DC
Start: 2019-03-13 — End: 2019-11-15

## 2019-05-23 IMAGING — CR CHEST
1 series · 1 of 1 positions shown · non-contrast
Comparison: none

[chest port x-wise]
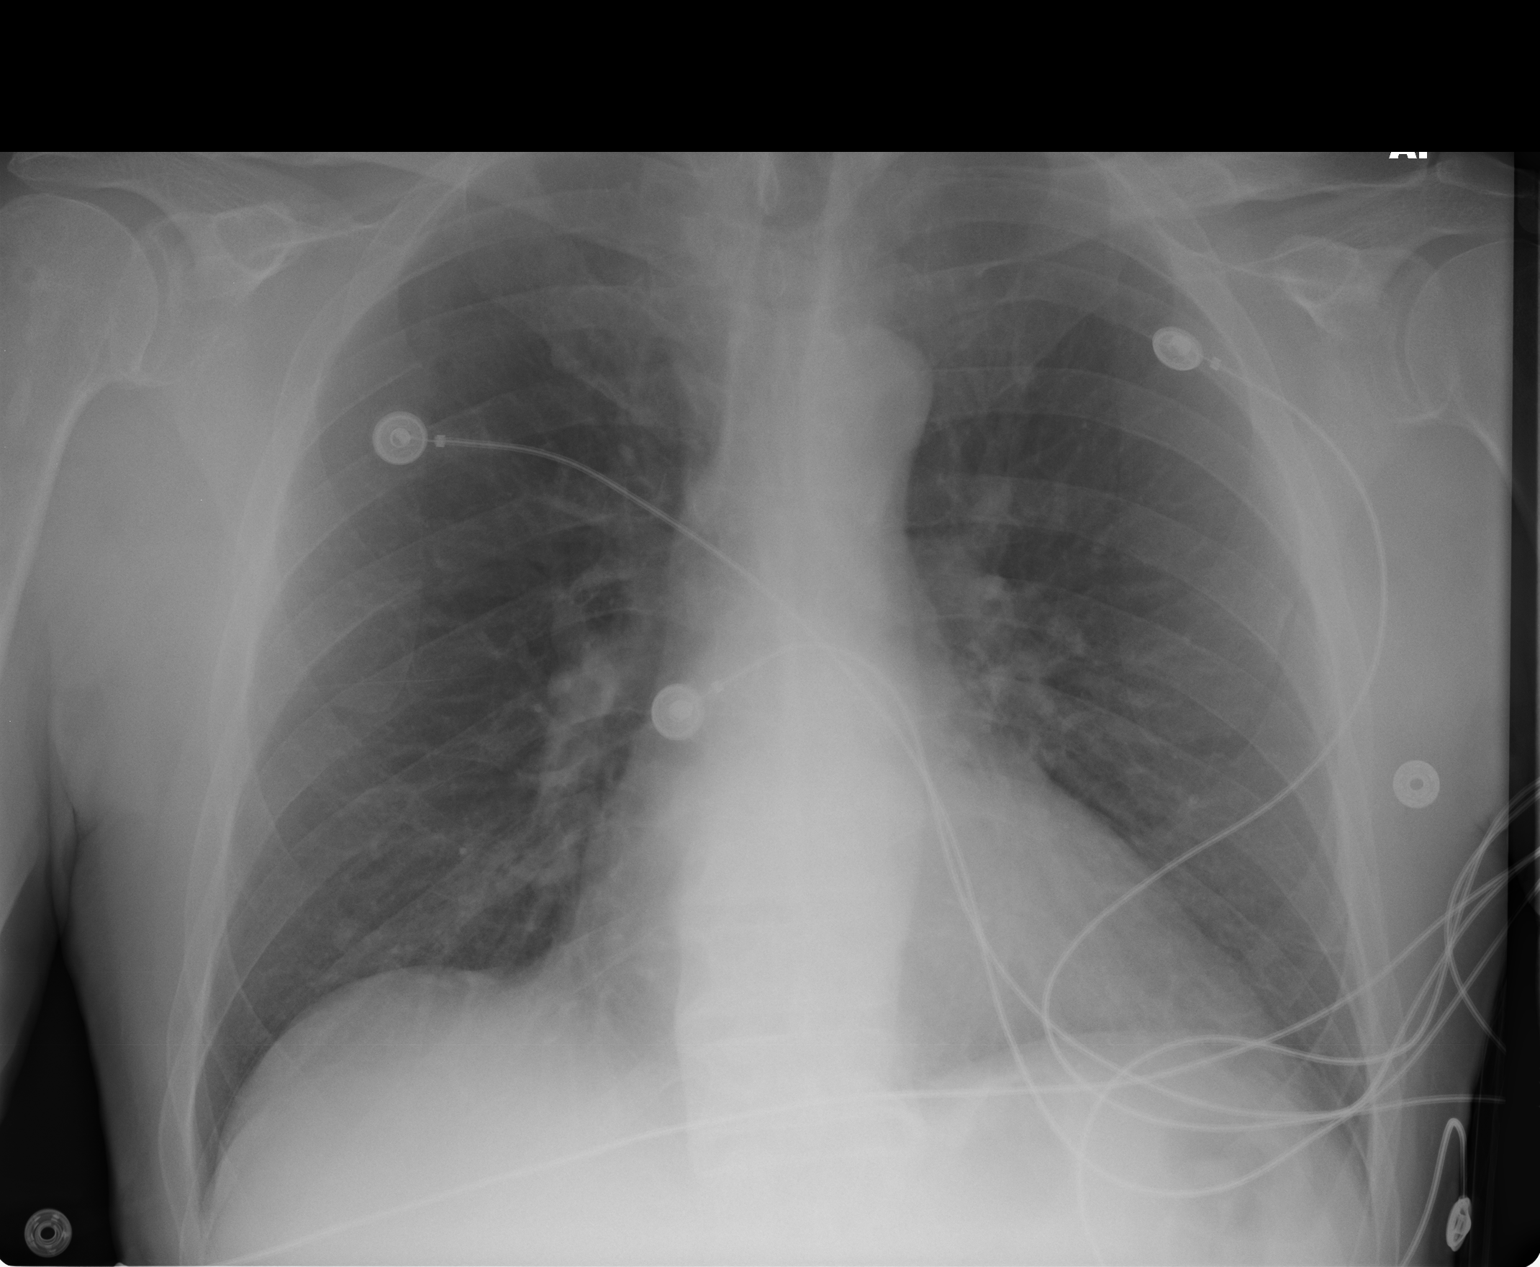

[1 of 1 positions shown; findings below may reference images not displayed]

DIAGNOSTIC STUDIES

EXAM

Chest radiograph.

INDICATION

cp
CHEST PAIN; HX OF 10 STENTS;

TECHNIQUE

AP chest.

COMPARISONS

None.

FINDINGS

Clear lungs. The cardiomediastinal silhouette is not enlarged for technique. Acromioclavicular
osteoarthropathy is present on the right with mild right glenohumeral osteoarthropathy also
present.

IMPRESSION

No acute cardiopulmonary pathology.

## 2019-06-10 ENCOUNTER — Encounter: Admit: 2019-06-10 | Discharge: 2019-06-10 | Payer: MEDICARE

## 2019-06-11 ENCOUNTER — Encounter: Admit: 2019-06-11 | Discharge: 2019-06-11 | Payer: MEDICARE

## 2019-06-11 DIAGNOSIS — N200 Calculus of kidney: Secondary | ICD-10-CM

## 2019-06-11 DIAGNOSIS — I10 Essential (primary) hypertension: Secondary | ICD-10-CM

## 2019-06-11 DIAGNOSIS — C61 Malignant neoplasm of prostate: Secondary | ICD-10-CM

## 2019-06-11 DIAGNOSIS — I209 Angina pectoris, unspecified: Secondary | ICD-10-CM

## 2019-06-11 DIAGNOSIS — E785 Hyperlipidemia, unspecified: Secondary | ICD-10-CM

## 2019-06-11 DIAGNOSIS — M1990 Unspecified osteoarthritis, unspecified site: Secondary | ICD-10-CM

## 2019-06-11 DIAGNOSIS — R109 Unspecified abdominal pain: Secondary | ICD-10-CM

## 2019-06-11 DIAGNOSIS — N400 Enlarged prostate without lower urinary tract symptoms: Secondary | ICD-10-CM

## 2019-06-11 DIAGNOSIS — I2510 Atherosclerotic heart disease of native coronary artery without angina pectoris: Secondary | ICD-10-CM

## 2019-06-11 DIAGNOSIS — I219 Acute myocardial infarction, unspecified: Secondary | ICD-10-CM

## 2019-06-11 DIAGNOSIS — J302 Other seasonal allergic rhinitis: Secondary | ICD-10-CM

## 2019-06-11 DIAGNOSIS — R3129 Other microscopic hematuria: Secondary | ICD-10-CM

## 2019-06-11 LAB — PROSTATIC SPECIFIC ANTIGEN-PSA: Lab: 3.5 ng/mL (ref <6.01)

## 2019-06-11 NOTE — Progress Notes
Date of Service: 06/11/2019     Subjective:             Timothy Sweeney is a 79 y.o. male who presents for follow-up.    History of Present Illness  Timothy Sweeney is a 79 y.o. male with a history of prostate cancer, diagnosed in June 2018, he had grade Group 1 disease. 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  He had an MRI of the pelvis performed on 09/27/2016, MRI revealed 2 PI-RADS 4 lesions, prostate volume of 64 mL. In the 18 months following his biopsy, he had significant cardiac event that required a bypass in March 2019. He has been unable to take tamsulosin due to orthostatic hypotension and was started on finasteride.  Unfortunately, his blood pressure and orthostatic hypotension are fluctuant and continue to occur.  He also has intermittent frequency and nocturia but it is relatively unchanged.  He states that he has not had any concerns from a cardiac standpoint since he was last seen.  He continues on Plavix and low-dose aspirin, which will be lifelong.    Medical History:   Diagnosis Date   ? Angina pectoris (HCC)    ? Arthritis    ? CAD (coronary artery disease) 07/10/2016   ? Enlarged prostate     by imaging   ? Flank pain, right 07/10/2016   ? Hyperlipidemia    ? Hypertension    ? Kidney stone     2 non-obstructing stones on imaging   ? Microhematuria 07/10/2016    -- tested in Urologist's office   ? Myocardial infarction (HCC) 1994, 1999, 2008    x10 stents   ? Prostate cancer [facility]) 05/07/2017 07/10/2016 PSA 6.9 09/11/2016 PSA 6.97 09/27/2016 - MRI prostate - results as follows: 1. No large focal lesion to suggest large volume, high-grade disease. 2. Several subcentimeter peripheral zone nodules, the largest of which  within the posterior left mid gland demonstrates marked restricted  diffusion and enhancement, most suggestive of small volume, high-grade  disease (PI-RADS 4). ?This is cont   ? Seasonal allergic reaction        Surgical History:   Procedure Laterality Date   ? HERNIA REPAIR Bilateral    ? PERCUTANEOUS CORONARY INTERVENTION      x4 with x10 stents       Family History   Problem Relation Age of Onset   ? Anemia Mother    ? Alzheimer's Mother    ? Cancer Father         MDS   ? Stroke Brother    ? Heart Disease Paternal Uncle        Current Outpatient Medications   Medication Sig Dispense Refill   ? acetaminophen SR (TYLENOL ARTHRITIS PAIN) 650 mg tablet Take one tablet to two tablets by mouth every 12 hours as needed for Pain.  0   ? aspirin EC 81 mg tablet Take 81 mg by mouth daily.     ? cholecalciferol (VITAMIN D-3) 1,000 units tablet Take one tablet by mouth daily. Indications: vitamin D deficiency  0   ? clopiDOGrel (PLAVIX) 75 mg tablet Take 75 mg by mouth daily.     ? evolocumab (REPATHA) 140 mg/mL injectable SYRINGE Inject 140 mg under the skin every 14 days.     ? ezetimibe (ZETIA) 10 mg tablet Take 10 mg by mouth  daily.     ? finasteride (PROSCAR) 5 mg tablet TAKE 1 TABLET BY MOUTH  DAILY 90 tablet 1   ? meloxicam (MOBIC) 15 mg tablet TAKE 1 TABLET BY MOUTH EVERY DAY 90 tablet 1   ? metoprolol tartrate (LOPRESSOR) 50 mg tablet Take 25 mg by mouth twice daily.     ? MULTIVITAMIN PO Take 1 tablet by mouth daily.     ? Niacin 500 mg cpER Take  by mouth.     ? nitroglycerin (NITROSTAT) 0.4 mg tablet Place 0.4 mg under tongue every 5 minutes as needed.     ? Omega-3 Acid Ethyl Esters 1 gram cap Take 1 g by mouth twice daily with meals. ? senna/docusate (STOOL SOFTENER-STIMULANT LAXAT) 8.6/50 mg tablet Take 50 tablets by mouth daily. 50 mg tablet       No current facility-administered medications for this visit.        Allergies   Allergen Reactions   ? Statins-Hmg-Coa Reductase Inhibitors UNKNOWN       Social History     Socioeconomic History   ? Marital status: Married     Spouse name: Not on file   ? Number of children: 5   ? Years of education: Not on file   ? Highest education level: Not on file   Occupational History     Employer: [employer]   Tobacco Use   ? Smoking status: Never Smoker   ? Smokeless tobacco: Never Used   Substance and Sexual Activity   ? Alcohol use: No   ? Drug use: No   ? Sexual activity: Not on file   Other Topics Concern   ? Not on file   Social History Narrative   ? Not on file       Review of Systems   Constitutional: Negative for activity change, appetite change, chills, diaphoresis, fatigue, fever and unexpected weight change.   HENT: Positive for rhinorrhea. Negative for congestion, hearing loss, mouth sores and sinus pressure.    Eyes: Negative for visual disturbance.   Respiratory: Negative for apnea, cough, chest tightness and shortness of breath.    Cardiovascular: Negative for chest pain, palpitations and leg swelling.   Gastrointestinal: Negative for abdominal pain, blood in stool, constipation, diarrhea, nausea, rectal pain and vomiting.   Genitourinary: Negative for decreased urine volume, difficulty urinating, discharge, dysuria, enuresis, flank pain, frequency, genital sores, hematuria, penile pain, penile swelling, scrotal swelling, testicular pain and urgency.   Musculoskeletal: Positive for back pain. Negative for arthralgias, gait problem and myalgias.   Skin: Negative for rash and wound.   Allergic/Immunologic: Positive for environmental allergies.   Neurological: Positive for numbness. Negative for dizziness, tremors, seizures, weakness, light-headedness and headaches. Hematological: Negative for adenopathy. Does not bruise/bleed easily.   Psychiatric/Behavioral: Negative for behavioral problems. The patient is not nervous/anxious.        Objective:         ? acetaminophen SR (TYLENOL ARTHRITIS PAIN) 650 mg tablet Take one tablet to two tablets by mouth every 12 hours as needed for Pain.   ? aspirin EC 81 mg tablet Take 81 mg by mouth daily.   ? cholecalciferol (VITAMIN D-3) 1,000 units tablet Take one tablet by mouth daily. Indications: vitamin D deficiency   ? clopiDOGrel (PLAVIX) 75 mg tablet Take 75 mg by mouth daily.   ? evolocumab (REPATHA) 140 mg/mL injectable SYRINGE Inject 140 mg under the skin every 14 days.   ? ezetimibe (ZETIA) 10  mg tablet Take 10 mg by mouth daily.   ? finasteride (PROSCAR) 5 mg tablet TAKE 1 TABLET BY MOUTH  DAILY   ? meloxicam (MOBIC) 15 mg tablet TAKE 1 TABLET BY MOUTH EVERY DAY   ? metoprolol tartrate (LOPRESSOR) 50 mg tablet Take 25 mg by mouth twice daily.   ? MULTIVITAMIN PO Take 1 tablet by mouth daily.   ? Niacin 500 mg cpER Take  by mouth.   ? nitroglycerin (NITROSTAT) 0.4 mg tablet Place 0.4 mg under tongue every 5 minutes as needed.   ? Omega-3 Acid Ethyl Esters 1 gram cap Take 1 g by mouth twice daily with meals.   ? senna/docusate (STOOL SOFTENER-STIMULANT LAXAT) 8.6/50 mg tablet Take 50 tablets by mouth daily. 50 mg tablet     Vitals:    06/11/19 0919   BP: (!) 148/90   BP Source: Arm, Right Upper   Patient Position: Sitting   Pulse: 78   Resp: 18   Temp: 36.7 ?C (98 ?F)   TempSrc: Oral   SpO2: 98%   Weight: 79.8 kg (176 lb)   Height: 183.2 cm (72.13)   PainSc: Zero     Body mass index is 23.79 kg/m?Marland Kitchen     Physical Exam   Constitutional: He appears well-developed and well-nourished.   HENT:   Head: Normocephalic and atraumatic.   Eyes: Pupils are equal, round, and reactive to light. Conjunctivae are normal. No scleral icterus.   Neck: Neck supple.   Cardiovascular: Normal rate. Pulmonary/Chest: Effort normal. No respiratory distress.   Abdominal: Soft.   Musculoskeletal: Normal range of motion.          Assessment and Plan:    Problem   Prostate Cancer (Hcc)    07/10/2016 PSA 6.9  09/11/2016 PSA 6.97  09/27/2016 - MRI prostate - results as follows:  1. No large focal lesion to suggest large volume, high-grade disease.  2. Several subcentimeter peripheral zone nodules, the largest of which   within the posterior left mid gland demonstrates marked restricted   diffusion and enhancement, most suggestive of small volume, high-grade   disease (PI-RADS 4). ?This is contoured in the Dynacad system as lesion 1.   Additional peripheral zone lesions are equivocal for high-grade disease   (PI-RADS 3).  3. Small ill-defined nodule in the left lateral prostate base, which is   indeterminate between a peripheral zone lesion or extruded transition zone   nodule and should be considered for high-grade disease (PI-RADS 4). This   is contoured in the Dynacad system as lesion 2.  4. Moderate nodular hyperplasia with several extruded nodules arising from   the transition zone (PI-RADS 2).    11/08/2016:  Targeted prostate biopsy:    One core showed Gleason grade 3+3=6 adenocarcinoma of the prostate involving 5% of needle core and measuring 1 mm in length    Labs:  [facility] PSA Hx:  Lab Results   Component Value Date    PSA 3.20 11/25/2018    PSA 3.20 06/03/2018    PSA 5.92 11/05/2017    PSA 7.55 (H) 05/07/2017    PSA 6.97 (H) 09/11/2016    PSA 6.90 (H) 07/10/2016          Prostate cancer (HCC) 79 year old male with an extensive cardiac history and grade group 1 prostate cancer in 1 of 14 cores in 2018. His mpMRI in 09/2016 demonstrated 2 Pi-RADS 4 lesions. Unfortunately he was unable to undergo a confirmatory biopsy due to a cardiac event  requiring bypass surgery.  This was a complex matter which is resulted in him being on dual antiplatelet therapy for life.  We discussed that his PSA has appropriately trended down and stable in the past.  His PSA has not returned today, but we will follow-up on this. He had a negative DRE 11/2018. We discussed the possible options including continued observation, repeat MRI and biopsy.     Plan:  - Continue finasteride  - Will follow-up on PSA  - Schedule mpMRI biopsy   > If stable- trend PSA   > If changed- cardiac clearance for biopsy    Patient seen and discussed with Dr. Jimmey Ralph, who directed plan of care.    Conrad Burlington, MD  Urology  Pager [phone]    Orders Placed This Encounter   ? MRI PELVIS WO/W CONTRAST     ATTESTATION    I personally performed the key portions of the E/M visit, discussed case with resident and concur with resident documentation of history, physical exam, assessment, and treatment plan unless otherwise noted.     Mr. Brabson is doing well today.  His cardiac status is stable and he reports no bothersome urinary symptoms.  We discussed options for managing his known low risk prostate cancer.  I discussed the role of biopsy but the competing risk from interruption of his antiplatelet therapy.  I recommended starting with an MRI of the prostate (he has an MRI conditional loop event recorder) and determine the timing of a biopsy based on those results.      Staff name:  Myna Hidalgo, MD Date:  06/11/2019

## 2019-06-24 ENCOUNTER — Ambulatory Visit: Admit: 2019-06-24 | Discharge: 2019-06-24 | Payer: MEDICARE

## 2019-06-24 ENCOUNTER — Encounter: Admit: 2019-06-24 | Discharge: 2019-06-24 | Payer: MEDICARE

## 2019-06-24 DIAGNOSIS — C61 Malignant neoplasm of prostate: Secondary | ICD-10-CM

## 2019-06-24 MED ORDER — SODIUM CHLORIDE 0.9 % IJ SOLN
50 mL | Freq: Once | INTRAVENOUS | 0 refills | Status: DC
Start: 2019-06-24 — End: 2019-06-29

## 2019-06-24 MED ORDER — GADOBENATE DIMEGLUMINE 529 MG/ML (0.1MMOL/0.2ML) IV SOLN
16 mL | Freq: Once | INTRAVENOUS | 0 refills | Status: CP
Start: 2019-06-24 — End: ?
  Administered 2019-06-25: 01:00:00 16 mL via INTRAVENOUS

## 2019-06-25 ENCOUNTER — Encounter: Admit: 2019-06-25 | Discharge: 2019-06-25 | Payer: MEDICARE

## 2019-06-25 NOTE — Telephone Encounter
I called Timothy Sweeney and discussed his MRI results.  I explained that his MRI is very stable and that we should plan on an active surveillance biopsy when safe to interrupt his platelet therapy.

## 2019-07-01 ENCOUNTER — Encounter: Admit: 2019-07-01 | Discharge: 2019-07-01 | Payer: MEDICARE

## 2019-07-01 ENCOUNTER — Ambulatory Visit: Admit: 2019-07-01 | Discharge: 2019-07-01 | Payer: MEDICARE

## 2019-07-01 DIAGNOSIS — R3129 Other microscopic hematuria: Secondary | ICD-10-CM

## 2019-07-01 DIAGNOSIS — I25118 Atherosclerotic heart disease of native coronary artery with other forms of angina pectoris: Secondary | ICD-10-CM

## 2019-07-01 DIAGNOSIS — M1990 Unspecified osteoarthritis, unspecified site: Secondary | ICD-10-CM

## 2019-07-01 DIAGNOSIS — I219 Acute myocardial infarction, unspecified: Secondary | ICD-10-CM

## 2019-07-01 DIAGNOSIS — I2510 Atherosclerotic heart disease of native coronary artery without angina pectoris: Secondary | ICD-10-CM

## 2019-07-01 DIAGNOSIS — J302 Other seasonal allergic rhinitis: Secondary | ICD-10-CM

## 2019-07-01 DIAGNOSIS — E785 Hyperlipidemia, unspecified: Secondary | ICD-10-CM

## 2019-07-01 DIAGNOSIS — N200 Calculus of kidney: Secondary | ICD-10-CM

## 2019-07-01 DIAGNOSIS — Z0000 Encounter for general adult medical examination without abnormal findings: Secondary | ICD-10-CM

## 2019-07-01 DIAGNOSIS — I209 Angina pectoris, unspecified: Secondary | ICD-10-CM

## 2019-07-01 DIAGNOSIS — N400 Enlarged prostate without lower urinary tract symptoms: Secondary | ICD-10-CM

## 2019-07-01 DIAGNOSIS — R109 Unspecified abdominal pain: Secondary | ICD-10-CM

## 2019-07-01 DIAGNOSIS — C61 Malignant neoplasm of prostate: Secondary | ICD-10-CM

## 2019-07-01 DIAGNOSIS — I10 Essential (primary) hypertension: Secondary | ICD-10-CM

## 2019-07-01 DIAGNOSIS — L989 Disorder of the skin and subcutaneous tissue, unspecified: Secondary | ICD-10-CM

## 2019-07-01 LAB — COMPREHENSIVE METABOLIC PANEL
Lab: 1.7 mg/dL — ABNORMAL HIGH (ref 0.3–1.2)
Lab: 140 MMOL/L (ref 137–147)
Lab: 16 U/L (ref 7–40)
Lab: 17 U/L (ref 7–56)
Lab: 29 MMOL/L (ref 21–30)
Lab: 4.7 MMOL/L (ref 3.5–5.1)
Lab: 4.8 g/dL (ref 3.5–5.0)
Lab: 64 U/L (ref 25–110)
Lab: >60 mL/min (ref >60)
Lab: >60 mL/min (ref >60)
Lab: 8 (ref 3–12)

## 2019-07-01 LAB — LIPID PROFILE
Lab: 120 mg/dL (ref <200)
Lab: 152 mg/dL — ABNORMAL HIGH (ref <150)
Lab: 30 mg/dL (ref 8.5–10.6)
Lab: 48 mg/dL (ref >40)
Lab: 61 mg/dL (ref <100)
Lab: 72 mg/dL (ref 6.0–8.0)

## 2019-07-01 LAB — PROSTATIC SPECIFIC ANTIGEN-PSA: Lab: 3.5 ng/mL (ref <6.01)

## 2019-07-01 NOTE — Progress Notes
Date of Service: 07/01/2019    Timothy Sweeney is a 79 y.o. male.  DOB: 04-Dec-1940  MRN: [id number]     Subjective:            He presents today for an Annual Medicare Wellness visit.    he was last seen by me 01/06/2019.  Patient states he is feeling well.  He exercises daily and enjoys this.  He has no concerns or questions today.  However, his wife is concerned about a lesion on his lower lip that has been there for couple months.  Patient states he thinks he is to have a lesion there in the past and it comes and goes but this time it has remained.  It sometimes scales and flakes off.  He denies any pain from this.  He has no history of skin cancer.              Chief Complaint   Patient presents with   ? Annual Wellness Visit       Medical History:   Diagnosis Date   ? Angina pectoris (HCC)    ? Arthritis    ? CAD (coronary artery disease) 07/10/2016   ? Enlarged prostate     by imaging   ? Flank pain, right 07/10/2016   ? Hyperlipidemia    ? Hypertension    ? Kidney Timothy Sweeney     2 non-obstructing stones on imaging   ? Microhematuria 07/10/2016    -- tested in Urologist's office   ? Myocardial infarction (HCC) 1994, 1999, 2008    x10 stents   ? Prostate cancer [facility]) 05/07/2017    07/10/2016 PSA 6.9 09/11/2016 PSA 6.97 09/27/2016 - MRI prostate - results as follows: 1. No large focal lesion to suggest large volume, high-grade disease. 2. Several subcentimeter peripheral zone nodules, the largest of which  within the posterior left mid gland demonstrates marked restricted  diffusion and enhancement, most suggestive of small volume, high-grade  disease (PI-RADS 4). ?This is cont   ? Seasonal allergic reaction      Surgical History:   Procedure Laterality Date   ? HERNIA REPAIR Bilateral    ? PERCUTANEOUS CORONARY INTERVENTION      x4 with x10 stents     Family History   Problem Relation Age of Onset   ? Anemia Mother    ? Alzheimer's Mother    ? Cancer Father         MDS   ? Stroke Brother    ? Heart Disease Paternal Uncle Social History     Socioeconomic History   ? Marital status: Married     Spouse name: Not on file   ? Number of children: 5   ? Years of education: Not on file   ? Highest education level: Not on file   Occupational History     Employer: [employer]   Tobacco Use   ? Smoking status: Never Smoker   ? Smokeless tobacco: Never Used   Substance and Sexual Activity   ? Alcohol use: No   ? Drug use: No   ? Sexual activity: Not on file   Other Topics Concern   ? Not on file   Social History Narrative   ? Not on file      I reviewed medications, allergies, problem list and tobacco history at this visit.    A Health Risk assessment was performed by the patient today, reviewed with the patient.  Health Risk Assessment Questionnaire  Current Care  List of Providers you have seen in the last two years: Dr. Vernie Ammons, Dr. Burman Foster, Dr. Rande Lawman, Dr. Ross Marcus, Dr. Gwenlyn Found,  Dr. Orson Gear  Are you receiving home health?: No  During the past 4 weeks, how would you rate your health in general?: Good    Outside Care  Since your last PCP visit, have you received care outside of [facility]?: (!) Yes  What type of care did you receive outside of [facility]? (select all that apply): (!) Specialty Visit, Emergency Room Visit, Hospitalization  What is the Facility where you received care and the provider's name?: [facility], [facility], [facility]     [facility], Cardiac Rehab,  [employer] [facility]    Physical Activity  Do you exercise or are you physically active?: Yes  How many days a week do you usually exercise or are physically active?: 7  On days when you exercised or were physically active, how many minutes was the activity?: 180  During the past four weeks, what was the hardest physical activity you could do for at least two minutes?: Heavy    Diet  In the past month, were you worried whether your food would run out before you or your family had money to buy more?: No  In the past 7 days, how many times did you eat fast food or junk food or pizza?: (!) 3 or more times  In the past 7 days, how many servings of fruits or vegetables did you eat each day?: 4-5  In the past 7 days, how many sodas and sugar sweetened drinks (regular, not diet) did you drink each day?: 1    Smoke/Tobacco Use  Are you currently a smoker?: No      Alcohol Use  Do you drink alcohol?: No          Depression Screen  Little interest or pleasure in doing things: Not at All  Feeling down, depressed or hopeless: Not at All    Patient Scores:  PHQ-2: PHQ-2 Score: 0 (06/24/2019 11:48 AM)    PHQ-9: No data recorded  Interventions:  PHQ-2: No data recorded  Depression Interventions PHQ-2/9: No data recorded      Pain  How would you rate your pain today?: (!) Mild pain    Ambulation  Do you use any assistive devices for ambulation?: No      Fall Risk  Does it take you longer than 30 seconds to get up and out of a chair?: No  Have you fallen in the past year?: No      Motor Vehicle Safety  Do you fasten your seat belt when you are in the car?: Yes    Sun Exposure  Do you protect yourself from the sun? For example, wear sunscreen when outside.: (!) No    Hearing Loss  Do you have trouble hearing the television or radio when others do not?: No  Do you have to strain or struggle to hear/understand conversation?: No  Do you use hearing aids?: No    Cognitive Impairment  During the past 12 months, have you experienced confusion or memory loss that is happening more often or is getting worse?: No    Functional Screen  Do you live alone?: No  Do you live at: Home  Can you drive your own car or travel alone by  bus or taxi?: Yes  Can you shop for groceries or clothes without help?: Yes  Can you prepare your own meals?: Yes  Can you do your own housework without help?: Yes  Can you handle your own money without help?: Yes  Do you need help eating, bathing, dressing, or getting around your home?: No  Do you feel safe?: Yes  Does anyone at home hurt you, hit you, or threaten you?: No  Have you ever been the victim of abuse?: No    Home Safety  Does your home have grab bars in the bathroom?: (!) No  Does your home have hand rails on stairs and steps?: Yes  Does your home have functioning smoke alarms?: Yes    Advance Directive  Do you have a living will or Advance Directive?: Yes      Dental Screen  Have you had an exam by your dentist in the last year?: Yes    Vision Screen  Do you have diabetes?: No          In the last 12 months, did you ever eat less than you should because there wasn't enough money for food?: No (06/24/2019 11:11 AM)  In the last 12 months, has your utility company shut off your service for not paying your bills?: No (06/24/2019 11:11 AM)  Are you worried that in the next 2 months, you may not have stable housing?: No (06/24/2019 11:11 AM)  Are you afraid you might be hurt in your home by someone you know?: No (06/24/2019 11:11 AM)  Are you afraid you might be hurt in your apartment building or neighborhood?: No (06/24/2019 11:11 AM)  Do problems getting child care make it difficult for you to work or study?: No (06/24/2019 11:11 AM)  In the last 12 months, have you needed to see a doctor, but could not because of cost?: No (06/24/2019 11:11 AM)  In the last 12 months, did you skip medications to save money?: No (06/24/2019 11:11 AM)  In the last 12 months, have you ever had to go without health care because you didn't have a way to get there?: No (06/24/2019 11:11 AM)  Do you have problems understanding what is told to you about your medical conditions?: No (06/24/2019 11:11 AM)  Do you often feel that you lack companionship?: No (06/24/2019 11:11 AM)  If you answered YES to any questions above, would you like to discuss help with your social work team?: No (06/24/2019 11:11 AM)      Personal prevention Plan reviewed with patient.    While the patient was here today, due to his/her multiple chronic conditions it would be in the best interest of the patient for me to monitor, assess and evaluate those as well. They are as follows.  Patient Active Problem List    Diagnosis Date Noted   ? DDD (degenerative disc disease), cervical 04/14/2018     Overview Note:     -- 10/2016 x-ray with moderate diffuse spondylosis throughout the cervical spine, with mild anterolisthesis of C3 on C4, C4 on C5, and C6 on C7, moderate to severe degenerative disc disease C6-C7, moderate diffuse facet arthrosis and uncovertebral joint hypertrophy and moderate or greater bilateral osseous neural foraminal stenosis at C3-C4 and C4-C5.        ? Right carpal tunnel syndrome 02/03/2018     Overview Note:     EMG 01/2018: Moderate right median nerve entrapment at the wrist     ? Cubital  tunnel syndrome on left 02/03/2018     Overview Note:     EMG 01/2018: Left mild ulnar nerve entrapment at the elbow     ? Vitamin D deficiency 01/29/2018     Overview Note:     Vitamin D  25-OH 28:  Started vitamin D3 1000 units qday       ? Prostate cancer (HCC) 05/07/2017     Overview Note:     07/10/2016 PSA 6.9  09/11/2016 PSA 6.97  09/27/2016 - MRI prostate - results as follows:  1. No large focal lesion to suggest large volume, high-grade disease.  2. Several subcentimeter peripheral zone nodules, the largest of which   within the posterior left mid gland demonstrates marked restricted   diffusion and enhancement, most suggestive of small volume, high-grade   disease (PI-RADS 4). ?This is contoured in the Dynacad system as lesion 1.   Additional peripheral zone lesions are equivocal for high-grade disease   (PI-RADS 3).  3. Small ill-defined nodule in the left lateral prostate base, which is   indeterminate between a peripheral zone lesion or extruded transition zone   nodule and should be considered for high-grade disease (PI-RADS 4). This   is contoured in the Dynacad system as lesion 2.  4. Moderate nodular hyperplasia with several extruded nodules arising from   the transition zone (PI-RADS 2).    11/08/2016:  Targeted prostate biopsy:    One core showed Gleason grade 3+3=6 adenocarcinoma of the prostate involving 5% of needle core and measuring 1 mm in length    Labs:   PSA Hx:  Lab Results   Component Value Date    PSA 3.20 11/25/2018    PSA 3.20 06/03/2018    PSA 5.92 11/05/2017    PSA 7.55 (H) 05/07/2017    PSA 6.97 (H) 09/11/2016    PSA 6.90 (H) 07/10/2016        ? Hemorrhoids 10/29/2016   ? Kidney stones 09/11/2016     Overview Note:            ? CAD (coronary artery disease) 07/10/2016   ? Microhematuria 07/10/2016     Overview Note:     -- tested in Urologist's office     ? Health care maintenance 07/10/2016     Overview Note:     Vaccines:   - Influenza: Fall 2019   - Pneumococcal: (Healthy patients at age 30 starting with PCV13 followed by PPSV23 1 year later)    - Prevnar 13: 10/2016    - Pneumovax 23: 06/2018; (<65 if chronic heart/lung/liver disease; or alcoholism/smoking/DM)   - Zoster: Due; (RZV: 2 doses 2-6 months apart for age >5)   - Tetanus: Due; (Q10years)   - Acellular pertussis: Due; (Once as an adult in Tdap form)  Screening:   - Colon Cancer: Last colonoscopy 03/2010 showed no polyps; (50-75: Q10 years unless abnormality; 75-85: individualized; > 85 don't screen)   - Prostate Cancer: Gleason 3+3 = 6 adenocarcinoma of the prostate. Last PSA 04/2017 7.55 (Q6M surveillance . Repeat biopsy 1 year per Uro-Onc)   - Lung Cancer: Non-smoker; (55-74 w/ either 30PY or quit < 15 years ago: Annual LDCT)   - Skin Cancer: No suspicious lesions   - DEXA: 01/2018 showed AP spin T score of 3.2 & mean femoral neck of 0.7. FRAX - PENDING; (Age 34; Fx < 50; RA; chronic steroid use)   - AAA: N/A, never smoker; (Age 10 if h/o smoking)   -  ASCVD: Allergic to statins, on ezetimibe & niacin, & welchol; (Assess at least every 4-6 years depending on risk factors)   - DM:  Random glucose 110 10/2016; (Q3 years starting at age 53 OR anyone with: physical inactivity, HTN, +FMHx (1st), high risk race, low HDL/High TAG, h/o gestational DM, PCOS, PE w/ e/o insulin resistance, PVD)   - HIV: N/A, aged out   - Hep C: N/A, birth year       ? Hyperlipidemia      Overview Note:     Currently on Rapatha per Dr. Janyth Contes  Fax: [phone]     ? Hypertension    ? Angina pectoris (HCC)    ? 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 Allergic rhinitis 04/08/2013   ? Dysphagia 04/08/2013   ? Dysphonia 04/08/2013       Other concerns addressed at this visit -  Skin lesion on lip    Records requested at the time of this visit:No    Prior consultations, labs, radiology reports reviewed at the time of this visit.Yes: Most recent labs and urology visit            Depression:  Patient Scores:  PHQ-2: PHQ-2 Score: 0 (06/24/2019 11:48 AM)    PHQ-9: No data recorded  Interventions:  PHQ-2: No data recorded  Depression Interventions PHQ-2/9: No data recorded  BMI:  Body mass index is 24.51 kg/m?Marland Kitchen  No data recorded  Wt Readings from Last 10 Encounters:   07/01/19 82.6 kg (182 lb)   06/24/19 79.8 kg (176 lb)   06/11/19 79.8 kg (176 lb)   01/06/19 81.2 kg (179 lb)   12/09/18 80.2 kg (176 lb 12.8 oz)   07/24/18 80.3 kg (177 lb)   07/07/18 80.3 kg (177 lb 1.6 oz)   06/03/18 79 kg (174 lb 3.2 oz)   04/14/18 78.9 kg (174 lb)   01/20/18 79.5 kg (175 lb 3.2 oz)       Falls:  Fall History (last 24mo): No Falls (06/11/2019  9:19 AM)  Fall Risk: None identified (06/11/2019  9:19 AM)                     Review of Systems   Constitution: Negative for fever and malaise/fatigue.   HENT: Negative for congestion.    Eyes: Negative for blurred vision.   Cardiovascular: Negative for chest pain and dyspnea on exertion.   Respiratory: Negative for cough and shortness of breath.    Skin: Positive for suspicious lesions. Negative for rash.   Musculoskeletal: Negative for back pain.   Gastrointestinal: Negative for abdominal pain, constipation, diarrhea, heartburn and nausea. Neurological: Negative for headaches.   Psychiatric/Behavioral: Negative for depression. The patient is not nervous/anxious.              Objective:         ? acetaminophen SR (TYLENOL ARTHRITIS PAIN) 650 mg tablet Take one tablet to two tablets by mouth every 12 hours as needed for Pain.   ? aspirin EC 81 mg tablet Take 81 mg by mouth daily.   ? cholecalciferol (VITAMIN D-3) 1,000 units tablet Take one tablet by mouth daily. Indications: vitamin D deficiency   ? clopiDOGrel (PLAVIX) 75 mg tablet Take 75 mg by mouth daily.   ? evolocumab (REPATHA) 140 mg/mL injectable SYRINGE Inject 140 mg under the skin every 14 days.   ? ezetimibe (ZETIA) 10 mg tablet Take 10 mg by mouth daily.   ?  finasteride (PROSCAR) 5 mg tablet TAKE 1 TABLET BY MOUTH  DAILY   ? metoprolol tartrate (LOPRESSOR) 50 mg tablet Take 25 mg by mouth twice daily.   ? MULTIVITAMIN PO Take 1 tablet by mouth daily.   ? Niacin 500 mg cpER Take  by mouth.   ? nitroglycerin (NITROSTAT) 0.4 mg tablet Place 0.4 mg under tongue every 5 minutes as needed.   ? Omega-3 Acid Ethyl Esters 1 gram cap Take 1 g by mouth twice daily with meals.   ? senna/docusate (STOOL SOFTENER-STIMULANT LAXAT) 8.6/50 mg tablet Take 50 tablets by mouth daily. 50 mg tablet     Vitals:    07/01/19 0923   BP: 132/72   BP Source: Arm, Left Upper   Patient Position: Sitting   Pulse: 72   Resp: 18   Temp: 36.2 ?C (97.2 ?F)   TempSrc: Temporal   Weight: 82.6 kg (182 lb)   Height: 183.5 cm (72.25)   PainSc: Zero     Body mass index is 24.51 kg/m?Marland Kitchen   Vitals:    07/01/19 0923   BP: 132/72   BP Source: Arm, Left Upper   Patient Position: Sitting   Pulse: 72       Physical Exam  Vitals signs and nursing note reviewed.   Constitutional:       Appearance: Normal appearance.   HENT:      Head: Normocephalic and atraumatic.      Right Ear: Tympanic membrane, ear canal and external ear normal.      Left Ear: Tympanic membrane, ear canal and external ear normal.      Nose: Nose normal. Mouth/Throat:      Mouth: Mucous membranes are moist.      Pharynx: Oropharynx is clear.   Eyes:      Extraocular Movements: Extraocular movements intact.      Pupils: Pupils are equal, round, and reactive to light.   Neck:      Musculoskeletal: Normal range of motion and neck supple.      Vascular: No carotid bruit.      Comments: No thyromegaly  Cardiovascular:      Rate and Rhythm: Normal rate and regular rhythm.      Pulses: Normal pulses.      Heart sounds: Normal heart sounds.   Pulmonary:      Effort: Pulmonary effort is normal.      Breath sounds: Normal breath sounds.   Abdominal:      General: Bowel sounds are normal.      Palpations: Abdomen is soft.   Musculoskeletal: Normal range of motion.   Lymphadenopathy:      Cervical: No cervical adenopathy.   Skin:     General: Skin is warm and dry.      Findings: Lesion (right lower side of lower lip with raised, firm lesion 3mm) present. No rash.   Neurological:      General: No focal deficit present.      Mental Status: He is alert and oriented to person, place, and time.   Psychiatric:         Mood and Affect: Mood normal.         Behavior: Behavior normal.         Health Maintenance   Topic Date Due   ? DTAP/TDAP VACCINES (1 - Tdap) 09/22/1960   ? HEPATITIS C SCREENING  09/22/1960   ? SHINGLES RECOMBINANT VACCINE (1 of 2) 09/22/1992   ? INFLUENZA VACCINE  12/19/2018   ?  PHYSICAL (COMPREHENSIVE) EXAM  06/30/2020   ? MEDICARE ANNUAL WELLNESS VISIT  06/30/2020   ? PNEUMONIA (PPSV23) VACCINE  Completed        The 10-year ASCVD risk score Denman George DC Jr., et al., 2013) is: 31.6%    Values used to calculate the score:      Age: 48 years      Sex: Male      Is Non-Hispanic African American: No      Diabetic: No      Tobacco smoker: No      Systolic Blood Pressure: 132 mmHg      Is BP treated: Yes      HDL Cholesterol: 46 MG/DL      Total Cholesterol: 187 MG/DL        Assessment and Plan:    Ishmael L. Erven was seen today for annual wellness visit.    Diagnoses and all orders for this visit:    Encounter for Medicare annual wellness exam    Annual physical exam    Coronary artery disease of native heart with stable angina pectoris, unspecified vessel or lesion type (HCC)    Hyperlipidemia, unspecified hyperlipidemia type  -     LIPID PROFILE; Future; Expected date: 07/01/2019  -     COMPREHENSIVE METABOLIC PANEL; Future; Expected date: 07/01/2019    Lesion of face  -     AMB REFERRAL TO DERMATOLOGY    No changes in medications.  Continue current meds.  Will order fasting labs to evaluate cholesterol and Chem-12.  Encourage patient to continue exercising as he is doing    Will refer to dermatology for lesion on face.  Patient Instructions     Health Maintenance   Topic Date Due   ? DTAP/TDAP VACCINES (1 - Tdap) 09/22/1960   ? HEPATITIS C SCREENING  09/22/1960   ? SHINGLES RECOMBINANT VACCINE (1 of 2) 09/22/1992   ? INFLUENZA VACCINE  12/19/2018   ? PHYSICAL (COMPREHENSIVE) EXAM  06/30/2020   ? MEDICARE ANNUAL WELLNESS VISIT  06/30/2020   ? PNEUMONIA (PPSV23) VACCINE  Completed         Visit Disposition     Dispositions    ? Return in about 1 year (around 06/30/2020) for Annual Physical.          Future Appointments   Date Time Provider Department Center   07/01/2019 10:30 AM Hiram Gash, MD [email or web address]     Return in about 1 year (around 06/30/2020) for Annual Physical.    I reviewed with the patient their current medications and specifically any new medications prescribed at the time of this visit and we reviewed the expected benefits and potential side effects. All questions are answered to the patient's satisfaction.    ? Health maintenance gaps were reviewed with the patient at the time of this visit.    ? I emphasized the importance of medication adherence.   ? The medical problems/diagnoses listed under assessment and plan were addressed at this visit and unless otherwise stated are adequately controlled.

## 2019-07-01 NOTE — Patient Instructions
Health Maintenance   Topic Date Due    DTAP/TDAP VACCINES (1 - Tdap) 09/22/1960    HEPATITIS C SCREENING  09/22/1960    SHINGLES RECOMBINANT VACCINE (1 of 2) 09/22/1992    INFLUENZA VACCINE  12/19/2018    PHYSICAL (COMPREHENSIVE) EXAM  06/30/2020    MEDICARE ANNUAL WELLNESS VISIT  06/30/2020    PNEUMONIA (PPSV23) VACCINE  Completed

## 2019-07-02 ENCOUNTER — Encounter: Admit: 2019-07-02 | Discharge: 2019-07-02 | Payer: MEDICARE

## 2019-08-05 ENCOUNTER — Ambulatory Visit: Admit: 2019-08-05 | Discharge: 2019-08-06 | Payer: MEDICARE

## 2019-08-05 ENCOUNTER — Encounter: Admit: 2019-08-05 | Discharge: 2019-08-05 | Payer: MEDICARE

## 2019-08-05 DIAGNOSIS — E785 Hyperlipidemia, unspecified: Secondary | ICD-10-CM

## 2019-08-05 DIAGNOSIS — D489 Neoplasm of uncertain behavior, unspecified: Secondary | ICD-10-CM

## 2019-08-05 DIAGNOSIS — I219 Acute myocardial infarction, unspecified: Secondary | ICD-10-CM

## 2019-08-05 DIAGNOSIS — R109 Unspecified abdominal pain: Secondary | ICD-10-CM

## 2019-08-05 DIAGNOSIS — N400 Enlarged prostate without lower urinary tract symptoms: Secondary | ICD-10-CM

## 2019-08-05 DIAGNOSIS — I10 Essential (primary) hypertension: Secondary | ICD-10-CM

## 2019-08-05 DIAGNOSIS — R3129 Other microscopic hematuria: Secondary | ICD-10-CM

## 2019-08-05 DIAGNOSIS — J302 Other seasonal allergic rhinitis: Secondary | ICD-10-CM

## 2019-08-05 DIAGNOSIS — I2510 Atherosclerotic heart disease of native coronary artery without angina pectoris: Secondary | ICD-10-CM

## 2019-08-05 DIAGNOSIS — N200 Calculus of kidney: Secondary | ICD-10-CM

## 2019-08-05 DIAGNOSIS — D229 Melanocytic nevi, unspecified: Secondary | ICD-10-CM

## 2019-08-05 DIAGNOSIS — I209 Angina pectoris, unspecified: Secondary | ICD-10-CM

## 2019-08-05 DIAGNOSIS — L821 Other seborrheic keratosis: Secondary | ICD-10-CM

## 2019-08-05 DIAGNOSIS — C61 Malignant neoplasm of prostate: Secondary | ICD-10-CM

## 2019-08-05 DIAGNOSIS — M1990 Unspecified osteoarthritis, unspecified site: Secondary | ICD-10-CM

## 2019-08-05 NOTE — Procedures
Shave biopsy procedure note    Risk and benefits of the above procedure including bleeding, pain, dyspigmentation, scar, infection, recurrence or nerve damage with loss of muscle function and/or skin sensation were discussed with the patient (or legal guardian) in detail, who afterwards decided to proceed with the procedure.    Diagnosis: see progress note  Body Site: see progress note  Preparation:  Alcohol   Anesthesia:  1% lidocaine with epinephrine  Instrument:  Dermablade  Hemostasis:  AlCl3  Closure:  None  Wound dressing:  Vaseline  Wound care instructions given:  Verbal  Complications:  None  Tolerated well: Yes  Ambulated from room:  Yes  Pathology sent to:  [facility]  Duration of procedure:  >5 minutes    Procedure Time Out Check List:  Prior to the start of the procedure, I personally confirmed the following:    Site Marking Verified: Yes, as appropriate  Patient Identity (name & date of birth): Yes  Procedure: Yes  Site: Yes  Body Part: see above    The risks of the procedure, including infection, bleeding, pain and skin changes, were discussed with the patient.

## 2019-08-05 NOTE — Progress Notes
ATTESTATION    I personally performed the key portions of the E/M visit, discussed case with resident and concur with resident documentation of history, physical exam, assessment, and treatment plan unless otherwise noted.    I performed the key components of the tangential (shave) biopsy or biopsies including site identification, discussion with patient, biopsy type and choice, and was present during the procedure.       Staff name:  Aloise Copus A Wang-Weinman, MD Date:  08/05/2019

## 2019-08-06 DIAGNOSIS — L989 Disorder of the skin and subcutaneous tissue, unspecified: Secondary | ICD-10-CM

## 2019-09-23 ENCOUNTER — Encounter: Admit: 2019-09-23 | Discharge: 2019-09-23 | Payer: MEDICARE

## 2019-09-23 NOTE — Telephone Encounter
Faxed request for antiplatelet guidance. No response to attempts in February.

## 2019-09-28 ENCOUNTER — Encounter: Admit: 2019-09-28 | Discharge: 2019-09-28 | Payer: MEDICARE

## 2019-09-28 MED ORDER — FINASTERIDE 5 MG PO TAB
ORAL_TABLET | Freq: Every day | 3 refills
Start: 2019-09-28 — End: ?

## 2019-09-28 NOTE — Telephone Encounter
Received request from [facility] for new Rx of finasteride 5 mg.     Last filled:   Disp Refills Start End    finasteride (PROSCAR) 5 mg tablet 90 tablet 1 03/13/2019     Sig: TAKE 1 TABLET BY MOUTH ?DAILY    Sent to pharmacy as: finasteride 5 mg tablet (PROSCAR)    Notes to Pharmacy: Requesting 1 year supply    E-Prescribing Status: Receipt confirmed by pharmacy (03/13/2019 11:59 AM CDT)        Last OV: 07/24/2018  Next OV: not scheduled    Rx on standing protocol but patient has not had visit with doctor in over 1 year. Sending to PCP for review.   Left message for patient to call back to schedule visit.

## 2019-11-15 ENCOUNTER — Encounter: Admit: 2019-11-15 | Discharge: 2019-11-15 | Payer: MEDICARE

## 2019-11-15 MED ORDER — FINASTERIDE 5 MG PO TAB
5 mg | ORAL_TABLET | Freq: Every day | ORAL | 1 refills | Status: AC
Start: 2019-11-15 — End: ?

## 2019-11-15 NOTE — Telephone Encounter
Pt calling requesting refill of a medication, did not specify in VM. Will forward to [facility], Dr. Lavone Neri nurse for further FU and refill.

## 2019-12-02 ENCOUNTER — Encounter: Admit: 2019-12-02 | Discharge: 2019-12-02 | Payer: MEDICARE

## 2020-01-10 ENCOUNTER — Encounter: Admit: 2020-01-10 | Discharge: 2020-01-10 | Payer: MEDICARE

## 2020-01-10 MED ORDER — CIPROFLOXACIN HCL 500 MG PO TAB
500 mg | ORAL_TABLET | Freq: Two times a day (BID) | ORAL | 0 refills | 10.00000 days | Status: AC
Start: 2020-01-10 — End: ?

## 2020-01-10 NOTE — Telephone Encounter
Spoke with patient to review biopsy instructions, medications and sent Cipro to his pharm on file. Will call with any additional questions.

## 2020-01-20 ENCOUNTER — Encounter: Admit: 2020-01-20 | Discharge: 2020-01-20 | Payer: MEDICARE

## 2020-01-20 DIAGNOSIS — C61 Malignant neoplasm of prostate: Secondary | ICD-10-CM

## 2020-01-20 DIAGNOSIS — I10 Essential (primary) hypertension: Secondary | ICD-10-CM

## 2020-01-20 DIAGNOSIS — N200 Calculus of kidney: Secondary | ICD-10-CM

## 2020-01-20 DIAGNOSIS — I209 Angina pectoris, unspecified: Secondary | ICD-10-CM

## 2020-01-20 DIAGNOSIS — E785 Hyperlipidemia, unspecified: Secondary | ICD-10-CM

## 2020-01-20 DIAGNOSIS — I2510 Atherosclerotic heart disease of native coronary artery without angina pectoris: Secondary | ICD-10-CM

## 2020-01-20 DIAGNOSIS — R109 Unspecified abdominal pain: Secondary | ICD-10-CM

## 2020-01-20 DIAGNOSIS — M1990 Unspecified osteoarthritis, unspecified site: Secondary | ICD-10-CM

## 2020-01-20 DIAGNOSIS — I219 Acute myocardial infarction, unspecified: Secondary | ICD-10-CM

## 2020-01-20 DIAGNOSIS — J302 Other seasonal allergic rhinitis: Secondary | ICD-10-CM

## 2020-01-20 DIAGNOSIS — N400 Enlarged prostate without lower urinary tract symptoms: Secondary | ICD-10-CM

## 2020-01-20 DIAGNOSIS — R3129 Other microscopic hematuria: Secondary | ICD-10-CM

## 2020-01-20 MED ORDER — GENTAMICIN 40 MG/ML IJ SOLN
80 mg | Freq: Once | INTRAMUSCULAR | 0 refills | Status: CP
Start: 2020-01-20 — End: ?
  Administered 2020-01-20: 15:00:00 80 mg via INTRAMUSCULAR

## 2020-01-20 MED ORDER — LIDOCAINE HCL 10 MG/ML (1 %) IJ SOLN
10 mL | Freq: Once | INTRAMUSCULAR | 0 refills | Status: CP
Start: 2020-01-20 — End: ?
  Administered 2020-01-20: 16:00:00 10 mL via INTRAMUSCULAR

## 2020-01-20 NOTE — Procedures
Pre-operative Diagnosis:  prostate cancer on active surveillance and an abnormal MRI of the prostate     Post-operative Diagnosis:  Same    Procedure:  MRI Fusion/TRUS guided prostate biopsy    Surgeon:  Ross Marcus, MD    Anesthesia:  Local lidocaine injection    EBL:  Minimal    Condition:  Stable    Complications:  None    Findings:  No hypoechoic lesions on diagnostic ultrasound.  No corporal amylacea.  No intraprostatic calcifications.  No intravesical protrusion.    Indications for procedure:  79 y.o. y/o male with prostate cancer on active surveillance and an MRI demonstrating  5 PI-RADS 3-4 lesions.    Description of the procedure: Risks, benefits and alternatives were described and patient wishes to proceed. PO and IM abx were administered in anticipation of the procedure. Informed consent was obtained from the patient.  All risks, benefits and alternatives were described in great detail.  He was placed on the table in the left lateral position.  Time out was performed with and team verification of name, date of birth, and planned procedure.  Ultrasound probe was inserted and a diagnostic ultrasound of the prostate was performed with prostate measurements and interpretation as described in the findings.  The prostate was then swept and imaged for MRI software fusion. UroNAV software was then used to register MRI and ultrasound images, with contouring performed to ensure accurate registration.  Biopsies of the target lesion along with systematic biopsy of the prostate were performed. Pressure was held after the procedure.    Specimen:  Prostate biopsy     Drains:  None    Disposition: Very strict call/return instructions were given to the patient regarding signs of retention, significant bleeding, and/or infection.

## 2020-01-20 NOTE — Patient Instructions
Prostate Needle Biopsy  Prostate needle biopsy is a test used tocheck forprostate cancer. You may hear it called a core needle biopsy. During the test, a thin, hollowneedle is used to take small pieces of tissue (called samples) out of the prostate. The samples are then tested in a lab to see if there are cancer cells in them. This test is the main way to diagnose prostate cancer.     Getting ready for the procedure  Prepare as you have been told. Here are some things you can expect:    Tell your healthcare provider about all medicines you take. This includes herbs, vitamins, and other supplements. Be sure to mention any blood thinners, including daily aspirin. You may need to stop taking some or all of them before the biopsy.   Ask if you can eat the day of your biopsy.   You may be told to use a laxative, enemas, or both before the biopsy. This is to clean stool out of your colon and rectum. Follow the instructions you are given.   Your healthcare provider may have you take antibiotics before the biopsy. If so, take these as directed.  Risks and possible complications  Your provider will talk with you about possible risks before the biopsy is done. Risks are rare, but include:    Trouble passing urine   Infection in your urinary tract or prostate   Blood infection   Bleeding inside your body  The day of the procedure  Prostate biopsy can be done in a healthcare provider's office or a hospital. It takes about45minutes. You'll be able to go home the same day. But you'll need someone to drive you home.   Transrectal ultrasound is often used during the procedure. This test uses sound waves to make images of your prostate on a computer screen.The images help the healthcare provider put the needle in the right place. In some cases, an MRI scan is used to "see" the prostate instead of or along with ultrasound.   During the biopsy:   If ultrasound will be used, you may be asked to drink water to fill  your bladder.    You will put on a hospital gown and lie on an exam table.   The ultrasound transducer, which is about the size of a finger,is lubricated. It's then put into your rectum. This will feel like a prostate exam and you'll feel pressure. The transducer is moved until the prostate can be seen on the screen.    Medicines called local anesthetics may be used to numb the biopsy area.You might also be given medicine to make you sleepy.   Using the ultrasound images as a guide, the biopsy tool (biopsy gun) is put against the prostate.  ? It may be put in through the rectum, then the needle goes through the wall of your rectum and into your prostate. This is called a transrectal ultrasound (TRUS) guided biopsy.  ? Or, the biopsy gun can be put against the skin between your scrotum and anus (perineum). The needle then goes through your skin into your prostate. This is the transperineal approach.   The needle is used to take tissue samples from the prostate. It moves in and out of your prostate very quickly. But you might feel stinging or burning. About 12 samples are taken from different parts of the prostate. These samples are sent to a lab to be tested.  After the biopsy  At first you may feel a   little lightheaded, especially if you had medicine to make you sleepy. You can stay on the table until you feel able to stand. You can go homeonce you're feeling better.   You can go back to your normal activities right away. But you might find it uncomfortable to sit for 1 to 2 days. You may see some blood in your stool for a few days. This is normal. You may also notice blood in your urine and semen for a few weeks after the biopsy. This is also normal. Be sure to finish your antibiotics as directed. Your healthcare provider can tell you more about what to expect.   Follow-up care  You will see your healthcare provider for a follow-up visit. Depending on the biopsy results, you may be need more tests. If  cancer is found, you and your healthcare provider can discuss what the next steps will be.   When to call your healthcare provider  Call your healthcare provider right away if you have any of these:    Blood clots in your urine   Bloody diarrhea   Blood in your urine or stool that doesn't go awayafter48 hours   Chest pain or trouble breathing (call 911)   Chills   Feeling weak   Fever of 100.4F (38C) or higher, or as directed by your healthcare provider   Cannot pass urine  [employer] last reviewed this educational content on 04/19/2018   2000-2021 [employer]. All rights reserved. This information is not intended as a substitute for professional medical care. Always follow your healthcare professional's instructions.

## 2020-01-20 NOTE — Progress Notes
Name: Timothy Sweeney          MRN: [id number]      DOB: May 16, 1941      AGE: 79 y.o.   DATE OF SERVICE: 01/20/2020    Subjective:             Reason for Visit:  Procedure      Timothy Sweeney is a 79 y.o. male.     Cancer Staging  No matching staging information was found for the patient.    History of Present Illness  Mr. Timothy Sweeney is a very pleasant 79 y.o. man who presents today for a fusion prostate biopsy for prostate cancer on active surveillance.  He has taken his antibiotics.  He is not currently on any antiplatelet therapy or anticoagulants..         Review of Systems   Constitutional: Negative.    HENT: Negative.    Eyes: Negative.    Respiratory: Negative.    Cardiovascular: Negative.    Gastrointestinal: Negative.    Endocrine: Negative.    Genitourinary: Negative.    Musculoskeletal: Negative.    Skin: Negative.    Allergic/Immunologic: Negative.    Neurological: Negative.    Hematological: Negative.    Psychiatric/Behavioral: Negative.          Objective:         ? acetaminophen SR (TYLENOL ARTHRITIS PAIN) 650 mg tablet Take one tablet to two tablets by mouth every 12 hours as needed for Pain.   ? aspirin EC 81 mg tablet Take 81 mg by mouth daily.   ? cholecalciferol (VITAMIN D-3) 1,000 units tablet Take one tablet by mouth daily. Indications: vitamin D deficiency   ? ciprofloxacin (CIPRO) 500 mg tablet Take one tablet by mouth every 12 hours.   ? clopiDOGrel (PLAVIX) 75 mg tablet Take 75 mg by mouth daily.   ? evolocumab (REPATHA) 140 mg/mL injectable SYRINGE Inject 140 mg under the skin every 14 days.   ? ezetimibe (ZETIA) 10 mg tablet Take 10 mg by mouth daily.   ? finasteride (PROSCAR) 5 mg tablet Take one tablet by mouth daily.   ? metoprolol tartrate (LOPRESSOR) 50 mg tablet Take 25 mg by mouth twice daily.   ? MULTIVITAMIN PO Take 1 tablet by mouth daily.   ? Niacin 500 mg cpER Take  by mouth.   ? nitroglycerin (NITROSTAT) 0.4 mg tablet Place 0.4 mg under tongue every 5 minutes as needed.   ? Omega-3 Acid Ethyl Esters 1 gram cap Take 1 g by mouth twice daily with meals.   ? senna/docusate (STOOL SOFTENER-STIMULANT LAXAT) 8.6/50 mg tablet Take 50 tablets by mouth daily. 50 mg tablet     Vitals:    01/20/20 1002   BP: (!) 149/86   BP Source: Arm, Right Upper   Patient Position: Sitting   Pulse: 72   Resp: 18   Temp: 36.6 ?C (97.8 ?F)   TempSrc: Temporal   SpO2: 100%   Weight: 79.6 kg (175 lb 6.4 oz)   Height: 185.4 cm (73)   PainSc: Zero     Body mass index is 23.14 kg/m?Marland Kitchen     Pain Score: Zero       Fatigue Scale: 0-None    Pain Addressed:  N/A    Patient Evaluated for a Clinical Trial: Patient not eligible for a treatment trial (including not needing treatment, needs palliative care, in remission).     [place] Cooperative Oncology Group performance status is 0,  Fully active, able to carry on all pre-disease performance without restriction.Marland Kitchen     Physical Exam          Assessment and Plan:  Assessment: Prostate cancer  -  Prostate biopsy  -  Risks reviewed  -  Consent obtained

## 2020-01-26 ENCOUNTER — Encounter: Admit: 2020-01-26 | Discharge: 2020-01-26 | Payer: MEDICARE

## 2020-01-26 DIAGNOSIS — C61 Malignant neoplasm of prostate: Secondary | ICD-10-CM

## 2020-01-26 NOTE — Telephone Encounter
I called Timothy Sweeney and discussed his pathology.  I recommended a return in 6 months with a PSA.  He voiced understanding.

## 2020-02-21 ENCOUNTER — Encounter: Admit: 2020-02-21 | Discharge: 2020-02-21 | Payer: MEDICARE

## 2020-03-22 ENCOUNTER — Ambulatory Visit: Admit: 2020-03-22 | Discharge: 2020-03-23 | Payer: MEDICARE

## 2020-03-22 ENCOUNTER — Encounter: Admit: 2020-03-22 | Discharge: 2020-03-22 | Payer: MEDICARE

## 2020-03-22 DIAGNOSIS — E785 Hyperlipidemia, unspecified: Secondary | ICD-10-CM

## 2020-03-22 DIAGNOSIS — D229 Melanocytic nevi, unspecified: Secondary | ICD-10-CM

## 2020-03-22 DIAGNOSIS — I209 Angina pectoris, unspecified: Secondary | ICD-10-CM

## 2020-03-22 DIAGNOSIS — L219 Seborrheic dermatitis, unspecified: Secondary | ICD-10-CM

## 2020-03-22 DIAGNOSIS — M1990 Unspecified osteoarthritis, unspecified site: Secondary | ICD-10-CM

## 2020-03-22 DIAGNOSIS — J302 Other seasonal allergic rhinitis: Secondary | ICD-10-CM

## 2020-03-22 DIAGNOSIS — R3129 Other microscopic hematuria: Secondary | ICD-10-CM

## 2020-03-22 DIAGNOSIS — I2510 Atherosclerotic heart disease of native coronary artery without angina pectoris: Secondary | ICD-10-CM

## 2020-03-22 DIAGNOSIS — I219 Acute myocardial infarction, unspecified: Secondary | ICD-10-CM

## 2020-03-22 DIAGNOSIS — N200 Calculus of kidney: Secondary | ICD-10-CM

## 2020-03-22 DIAGNOSIS — R109 Unspecified abdominal pain: Secondary | ICD-10-CM

## 2020-03-22 DIAGNOSIS — C61 Malignant neoplasm of prostate: Secondary | ICD-10-CM

## 2020-03-22 DIAGNOSIS — N400 Enlarged prostate without lower urinary tract symptoms: Secondary | ICD-10-CM

## 2020-03-22 DIAGNOSIS — I10 Essential (primary) hypertension: Secondary | ICD-10-CM

## 2020-03-22 DIAGNOSIS — L821 Other seborrheic keratosis: Secondary | ICD-10-CM

## 2020-03-22 MED ORDER — KETOCONAZOLE 2 % TP CREA
Freq: Two times a day (BID) | TOPICAL | 3 refills | 30.00000 days | Status: AC
Start: 2020-03-22 — End: ?

## 2020-03-22 NOTE — Progress Notes
ATTESTATION    I personally performed the key portions of the E/M visit, discussed case with resident and concur with resident documentation of history, physical exam, assessment, and treatment plan unless otherwise noted.    Staff name:  Devona Konig, MD Date:  03/22/2020

## 2020-03-22 NOTE — Patient Instructions
Vitamin D  - We recommend Vitamin D supplementation after a fatty meal, especially if there is no contraindications such as kidney stones    Moles  --Common melanocytic nevi (moles) tend to be =6 mm in diameter and symmetric with even pigmentation, round or oval shape, regular outline, and sharp, non-fuzzy border.  --Dermal nevi stick out from the skin, but if they are soft they are usually not worrisome.  --Flaky seemingly stuck-on brown bumps are usually benign keratoses, not moles.  --Bright red smooth bumps that do not bleed are usually benign blood vessel lesions (cherry angiomas), not moles.  --Atypical nevi/clinical features of possible melanoma include asymmetry, border irregularities, color variability, and diameter >6 mm.  The earliest sign of melanoma is usually a rapidly growing mole.  --About half of melanoma arises in an existing mole and up to half on normal skin.  --It is normal to get new moles until the age of 7-45 years old.  --Multiple atypical nevi are a marker of increased risk of melanoma. The risk of melanoma depends also upon the total number of nevi, family and/or personal history of melanoma, and sun exposure history.   --It is important to look at your moles once a month.  It may help to follow them with photos such as on your smart phone.  Looking once a month you can notice rapid changes and call if these occur.  --Using sunscreens and sun avoidance will decrease your risk of developing melanoma.  The best sun protection is sun avoidance, including with clothing such as long sleeves and a broad brimmed hat.  The best sunscreens are SPF 30 or above cream based with zinc oxide.  One ounce (shot glass sized) amount is needed for an adult.  It should be reapplied every 2 hours if possible.  --Any tanning bed use will increase your risk of melanoma significantly.    Sun Protection   UPF/SPF rated clothing (gloves, long sleeves, scarves); broad-brimmed hats (NOT ball caps!)   [employer] additive can increase the SPF value of your everyday clothing   Cowboy hats protect from sun; baseball hats don't   Here are several recommended zinc-based sunscreen brands in aplphabetical order (always read the ingredient list, as many brands have multiple varieties of sunscreens and not all are zinc-based)   Cyndia Bent, [employer], [employer], Culp, [place], [employer]dess [employer], Green Screen,  [employer], Forensic scientist, SkinCeuticals, Vanicream   2 shot-glases = whole body    Melanoma Patient Information    Also called malignant melanoma     Skin cancer screening: If you notice a mole that differs from others or one that changes, bleeds, or itches, see a dermatologist.   Melanoma is a type of skin cancer. Anyone can get melanoma. When found early and treated, the cure rate is nearly 100%. Allowed to grow, melanoma can spread to other parts of the body. Melanoma can spread quickly. When melanoma spreads, it can be deadly.Dermatologists believe that the number of deaths from melanoma would be much lower if people:  Knew the warning signs of melanoma.   Learned how to examine their skin for signs of skin cancer.   Took the time to examine their skin.   It's important to take time to look at the moles on your skin because this is a good way to find melanoma early. When checking your skin, you should look for the ABCDEs of melanoma.     ABCDE's of melanoma:  When performing monthly  skin exams for your moles or new moles, remember the ABCDE's of melanoma:    A - Asymmetry. (Concerning if spot is not symmetric)  B - Border. (Irregular border or notched border are concerning)  C - Color. (Multiple colors or changes in color are concerning.)  D - Diameter. (Larger than 6mm, ie, a pencil eraser, is concerning.)  E - Evolution. (An evolving or changing spot is concerning. If new itch, tenderness, or bleeding develop, these are concerning changes too.  See further explanation below:    Melanoma: Signs and symptoms Anyone can get melanoma. It's important to take time to look at the moles on your skin because this is a good way to find melanoma early. When checking your skin, you should look for the ABCDEs of melanoma.     ABCDEs of melanoma     A = Asymmetry  One half is unlike the other half.       B = Border  An irregular, scalloped, or poorly defined border.       C = Color  Is varied from one area to another; has shades of tan, brown or black, or is sometimes white, red, or blue.       D = Diameter  Melanomas usually greater than 6mm (the size of a pencil eraser) when diagnosed, but they can be smaller.       E = Evolving  A mole or skin lesion that looks different from the rest or is changing in size, shape, or color.    !! If you see a mole or new spot on your skin that has any of the ABCDEs, immediately make an appointment to see a dermatologist.    Signs of melanoma  The most common early signs (what you see) of melanoma are:     Growing mole on your skin.   Unusual looking mole on your skin or a mole that does not look like any other mole on your skin (the ugly duckling).   Non-uniform mole (has an odd shape, uneven or uncertain border, different colors).     Symptoms of melanoma  In the early stages, melanoma may not cause any symptoms (what you feel). But sometimes melanoma will:    Itch.    Bleed.    Feel painful.   Many melanomas have these signs and symptoms, but not all. There are different types of melanoma. One type can first appear as a brown or black streak underneath a fingernail or toenail. Melanoma also can look like a bruise that just won't heal.     Who gets melanoma?  Anyone can get melanoma. Most people who get it have light skin, but people who have brown and black skin also get melanoma.   Some people have a higher risk of getting melanoma. These people have the following traits:   Skin    Fair skin (The risk is higher if the person also has red or blond hair and blue or green eyes). Sun-sensitive skin (rarely tans or burns easily).    50-plus moles, large moles, or unusual-looking moles.    If you have had bad sunburns or spent time tanning (sun, tanning beds, or sun lamps), you also have a higher risk of getting melanoma.   Men older than 50 are at a higher risk for developing skin cancers, including melanoma. Learning how to check your skin and getting skin exams can help detect skin cancer.    Family/medical history   Melanoma runs  in the family (parent, child, sibling, cousin, aunt, uncle had melanoma).   You had another skin cancer, but most especially another melanoma.   A weakened immune system.      Research shows that indoor tanning increases a person's melanoma risk by 75%. The risk also may increase if you had breast or thyroid cancer.    More people getting melanoma  Fewer people are getting most types of cancer. Melanoma is different. More people are getting melanoma. Many are white men who are 50 years or older. More young people also are getting melanoma. Melanoma is now the most common cancer among people 19-66 years old. Even teenagers are getting melanoma.    What causes melanoma?  Ultraviolet (UV) radiation is a major contributor in most cases. We get UV radiation from the sun, tanning beds, and sun lamps. Heredity also plays a role. Research shows that if a close blood relative (parent, child, sibling, aunt, uncle) had melanoma, a person has a much greater risk of getting melanoma.     How do dermatologists diagnose melanoma?  To diagnose melanoma, a dermatologist begins by looking at the patient's skin. A dermatologist will carefully examine moles and other suspicious spots. To get a better look, a dermatologist may use a device called a dermoscope.      Vitamin D    Our bodies need vitamin D to build strong and healthy bones. Vitamin D helps the body absorb the calcium that our bones require.     For a healthy person, the recommended daily dietary allowance is 600 international units for people of 56-6 years old, and 800 international units for people older than 71 years. More vitamin D is not better. Higher amounts of vitamin D could be harmful, leading to many health problems such as high blood pressure and kidney damage.     American academy of Dermatology recommending everyone get vitamin D from foods naturally rich in vitamin D, foods and beverages fortified with vitamin D or vitamin D supplements. The foods that contain the greatest amount are fatty fishes such as salmon, tuna and mackerel. Fish liver oil is another good source.     One of the sources to look up vitamin amount is through [employer]. [employer]. This can help you find out whether you get enough vitamin D from your diet. If you are like many people, you may not be getting your recommended dietary allowance of vitamin D. You may want to change the foods that you eat or take a vitamin D supplements. Before you start taking a vitamin D supplement, talk with your doctor.     Vitamin D is produced in the skin by UV light, but the amount is highly variable and depends on many factors. However, getting vitamin D from the sun or tanning beds can 1) increase your risk of developing skin cancer including melanoma which can be deadly, 2) resulting premature skin aging (wrinkles, age spots, blotchy complexion) and 3) leading to a weakened immune system. Therefore, Teacher, music of Dermatology recommend getting vitamin D safely from foods, beverages and supplements.

## 2020-04-28 ENCOUNTER — Encounter: Admit: 2020-04-28 | Discharge: 2020-04-28 | Payer: MEDICARE

## 2020-04-28 MED ORDER — FINASTERIDE 5 MG PO TAB
5 mg | ORAL_TABLET | Freq: Every day | ORAL | 1 refills | Status: AC
Start: 2020-04-28 — End: ?

## 2020-07-05 ENCOUNTER — Encounter

## 2020-07-05 DIAGNOSIS — R3129 Other microscopic hematuria: Secondary | ICD-10-CM

## 2020-07-05 DIAGNOSIS — C61 Malignant neoplasm of prostate: Secondary | ICD-10-CM

## 2020-07-05 DIAGNOSIS — Z0000 Encounter for general adult medical examination without abnormal findings: Principal | ICD-10-CM

## 2020-07-05 DIAGNOSIS — I10 Essential (primary) hypertension: Secondary | ICD-10-CM

## 2020-07-05 DIAGNOSIS — I219 Acute myocardial infarction, unspecified: Secondary | ICD-10-CM

## 2020-07-05 DIAGNOSIS — R109 Unspecified abdominal pain: Secondary | ICD-10-CM

## 2020-07-05 DIAGNOSIS — M5412 Radiculopathy, cervical region: Secondary | ICD-10-CM

## 2020-07-05 DIAGNOSIS — I25118 Atherosclerotic heart disease of native coronary artery with other forms of angina pectoris: Secondary | ICD-10-CM

## 2020-07-05 DIAGNOSIS — N400 Enlarged prostate without lower urinary tract symptoms: Secondary | ICD-10-CM

## 2020-07-05 DIAGNOSIS — J302 Other seasonal allergic rhinitis: Secondary | ICD-10-CM

## 2020-07-05 DIAGNOSIS — Z1159 Encounter for screening for other viral diseases: Secondary | ICD-10-CM

## 2020-07-05 DIAGNOSIS — M1990 Unspecified osteoarthritis, unspecified site: Secondary | ICD-10-CM

## 2020-07-05 DIAGNOSIS — I2510 Atherosclerotic heart disease of native coronary artery without angina pectoris: Secondary | ICD-10-CM

## 2020-07-05 DIAGNOSIS — E785 Hyperlipidemia, unspecified: Secondary | ICD-10-CM

## 2020-07-05 DIAGNOSIS — I209 Angina pectoris, unspecified: Secondary | ICD-10-CM

## 2020-07-05 DIAGNOSIS — N200 Calculus of kidney: Secondary | ICD-10-CM

## 2020-07-05 LAB — CBC AND DIFF
Lab: 0 K/UL (ref 0–0.20)
Lab: 0 K/UL (ref 0–0.45)
Lab: 0.4 K/UL (ref 0–0.80)
Lab: 1 % (ref 0–2)
Lab: 1.4 K/UL (ref 1.0–4.8)
Lab: 15 g/dL (ref 13.5–16.5)
Lab: 30 pg (ref 26–34)
Lab: 34 g/dL — ABNORMAL HIGH (ref 32.0–36.0)
Lab: 4.8 K/UL (ref 1.8–7.0)
Lab: 4.8 M/UL — ABNORMAL HIGH (ref 4.4–5.5)
Lab: 6.9 K/UL (ref 4.5–11.0)
Lab: 9.7 FL (ref 7–11)
Lab: 90 FL (ref 80–100)

## 2020-07-05 LAB — COMPREHENSIVE METABOLIC PANEL
Lab: 1.1 mg/dL (ref 0.4–1.24)
Lab: 10 % (ref 3–12)
Lab: 14 U/L — ABNORMAL LOW (ref 7–56)
Lab: 141 MMOL/L (ref 137–147)
Lab: 29 MMOL/L (ref 21–30)
Lab: 4.5 g/dL (ref 3.5–5.0)
Lab: 4.6 MMOL/L (ref 3.5–5.1)
Lab: 80 U/L (ref 25–110)
Lab: >60 mL/min (ref >60)

## 2020-07-05 LAB — TSH WITH FREE T4 REFLEX: Lab: 4.4 uU/mL (ref 0.35–5.00)

## 2020-07-05 LAB — HEPATITIS C ANTIBODY W REFLEX HCV PCR QUANT

## 2020-07-05 LAB — PROSTATIC SPECIFIC ANTIGEN-PSA: Lab: 4.5 ng/mL (ref <6.01)

## 2020-07-05 MED ORDER — GABAPENTIN 300 MG PO CAP
300 mg | ORAL_CAPSULE | Freq: Every evening | ORAL | 1 refills | Status: AC
Start: 2020-07-05 — End: ?

## 2020-07-05 MED ORDER — GABAPENTIN 100 MG PO CAP
100 mg | ORAL_CAPSULE | Freq: Two times a day (BID) | ORAL | 1 refills | Status: AC
Start: 2020-07-05 — End: ?

## 2020-07-05 NOTE — Progress Notes
Date of Service: 07/05/2020    Timothy Sweeney is a 80 y.o. male.  DOB: [birth year]  MRN: [id number]     Subjective:            He presents today for an Annual Medicare Wellness visit.    he was last seen by me 07/01/2019.  Patient states he is overall doing well and has no concerns today.  He continues to follow with urology and oncology for his prostate cancer and underwent biopsies that looked clean earlier this fall.  He also follows with cardiology at [facility].  Patient does complain of some numbness in his hands bilaterally that occurs off and on during the day but mostly at night.  He would like to try medication for this if it is helpful.  He denies any recent injury.  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              Chief Complaint   Patient presents with   ? Annual Wellness Visit     Medicare [id number]/Fasting labs/Depression=0   ? Cholesterol     Zetia 10mg    ? Hypertension       Medical History:   Diagnosis Date   ? Angina pectoris (HCC)    ? Arthritis    ? CAD (coronary artery disease) 07/10/2016   ? Enlarged prostate     by imaging   ? Flank pain, right 07/10/2016   ? Hyperlipidemia    ? Hypertension    ? Kidney [place]     2 non-obstructing stones on imaging   ? Microhematuria 07/10/2016    -- tested in Urologist's office   ? Myocardial infarction (HCC) 1994, 1999, 2008    x10 stents   ? Prostate cancer [facility]) 05/07/2017    07/10/2016 PSA 6.9 09/11/2016 PSA 6.97 09/27/2016 - MRI prostate - results as follows: 1. No large focal lesion to suggest large volume, high-grade disease. 2. Several subcentimeter peripheral zone nodules, the largest of which  within the posterior left mid gland demonstrates marked restricted  diffusion and enhancement, most suggestive of small volume, high-grade  disease (PI-RADS 4). ?This is cont   ? Seasonal allergic reaction      Surgical History:   Procedure Laterality Date   ? HERNIA REPAIR Bilateral    ? PERCUTANEOUS CORONARY INTERVENTION      x4 with x10 stents     Family History   Problem Relation Age of Onset   ? Anemia Mother    ? Alzheimer's Mother    ? Cancer Father         MDS   ? Stroke Brother    ? Heart Disease Paternal Uncle    ? Melanoma Neg Hx      Social History     Socioeconomic History   ? Marital status: Married     Spouse name: Not on file   ? Number of children: 5   ? Years of education: Not on file   ? Highest education level: Not on file   Occupational History     Employer: [employer]   Tobacco Use   ? Smoking status: Never Smoker   ? Smokeless tobacco: Never Used   Substance and Sexual Activity   ? Alcohol use: No   ? Drug use: No   ? Sexual activity: Not on file   Other Topics Concern   ? Not on file   Social  History Narrative   ? Not on file      I reviewed medications, allergies, problem list and tobacco history at this visit.    A Health Risk assessment was performed by the patient today, reviewed with the patient.      Health Risk Assessment Questionnaire  Current Care  List of Providers you have seen in the last two years: Dr. Jimmey Ralph, Dr. Larina Bras, Dr. Janyth Contes, Dr. Modesto Charon .  Are you receiving home health?: No  During the past 4 weeks, how would you rate your health in general?: Good    Outside Care  Since your last PCP visit, have you received care outside of [facility]?: (!) Yes  What type of care did you receive outside of [facility]? (select all that apply): (!) Specialty Visit  What is the Facility where you received care and the provider's name?: [facility], Dr. Janyth Contes    Physical Activity  Do you exercise or are you physically active?: Yes  How many days a week do you usually exercise or are physically active?: 5  On days when you exercised or were physically active, how many minutes was the activity?: 60  During the past four weeks, what was the hardest physical activity you could do for at least two minutes?: Heavy    Diet  In the past month, were you worried whether your food would run out before you or your family had money to buy more?: No  In the past 7 days, how many times did you eat fast food or junk food or pizza?: 0  In the past 7 days, how many servings of fruits or vegetables did you eat each day?: 4-5  In the past 7 days, how many sodas and sugar sweetened drinks (regular, not diet) did you drink each day?: (!) 3 or more sweet drinks    Smoke/Tobacco Use  Are you currently a smoker?: No      Alcohol Use  Do you drink alcohol?: No          Depression Screen  Little interest or pleasure in doing things: Not at All  Feeling down, depressed or hopeless: Not at All    Patient Scores:  PHQ-2: PHQ-2 Score: 0 (07/05/2020  9:16 AM)    PHQ-9: No data recorded  Interventions:  PHQ-2: PHQ-2 Score less than 3: No follow-up or recommendations are necessary at this time (07/05/2020  9:16 AM)    Depression Interventions PHQ-2/9: No data recorded      Pain  How would you rate your pain today?: (!) Mild pain    Ambulation  Do you use any assistive devices for ambulation?: No      Fall Risk  Does it take you longer than 30 seconds to get up and out of a chair?: No  Have you fallen in the past year?: (!) Yes  Fall History (last 57mo): (!) One Fall without Major Injury    Motor Vehicle Safety  Do you fasten your seat belt when you are in the car?: Yes    Sun Exposure  Do you protect yourself from the sun? For example, wear sunscreen when outside.: (!) No    Hearing Loss  Do you have trouble hearing the television or radio when others do not?: No  Do you have to strain or struggle to hear/understand conversation?: No  Do you use hearing aids?: No    Cognitive Impairment  During the past  12 months, have you experienced confusion or memory loss that is happening more often or is getting worse?: No    Functional Screen  Do you live alone?: No  Do you live at: Home  Can you drive your own car or travel alone by bus or taxi?: Yes  Can you shop for groceries or clothes without help?: Yes  Can you prepare your own meals?: Yes  Can you do your own housework without help?: Yes  Can you handle your own money without help?: Yes  Do you need help eating, bathing, dressing, or getting around your home?: No  Do you feel safe?: Yes  Does anyone at home hurt you, hit you, or threaten you?: No  Have you ever been the victim of abuse?: No    Home Safety  Does your home have grab bars in the bathroom?: (!) No  Does your home have hand rails on stairs and steps?: (!) No  Does your home have functioning smoke alarms?: Yes    Advance Directive  Do you have a living will or Advance Directive?: (!) No  Are you interested in discussing the importance of a living will or Advance Directive?: No    Dental Screen  Have you had an exam by your dentist in the last year?: Yes    Vision Screen  Do you have diabetes?: No          In the last 12 months, did you ever eat less than you should because there wasn't enough money for food?: No (06/28/2020  8:58 AM)  In the last 12 months, has your utility company shut off your service for not paying your bills?: No (06/28/2020  8:58 AM)  Are you worried that in the next 2 months, you may not have stable housing?: No (06/28/2020  8:58 AM)  Are you afraid you might be hurt in your home by someone you know?: No (06/28/2020  8:58 AM)  Are you afraid you might be hurt in your apartment building or neighborhood?: No (06/28/2020  8:58 AM)  Do problems getting child care make it difficult for you to work or study?: No (06/28/2020  8:58 AM)  In the last 12 months, have you needed to see a doctor, but could not because of cost?: No (06/28/2020  8:58 AM)  In the last 12 months, did you skip medications to save money?: No (06/28/2020  8:58 AM)  In the last 12 months, have you ever had to go without health care because you didn't have a way to get there?: No (06/28/2020  8:58 AM)  Do you have problems understanding what is told to you about your medical conditions?: No (06/28/2020  8:58 AM)  Do you often feel that you lack companionship?: No (06/28/2020  8:58 AM)  If you answered YES to any questions above, would you like to discuss help with your social work team?: No (06/28/2020  8:58 AM)      Personal prevention Plan reviewed with patient.    While the patient was here today, due to his/her multiple chronic conditions it would be in the best interest of the patient for me to monitor, assess and evaluate those as well. They are as follows.  Patient Active Problem List    Diagnosis Date Noted   ? 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        ? Right carpal tunnel syndrome 02/03/2018     Overview Note:     EMG 01/2018: Moderate right median nerve entrapment at the wrist     ? 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 Vitamin D deficiency 01/29/2018     Overview Note:     Vitamin D  25-OH 28:  Started vitamin D3 1000 units qday       ? Prostate cancer (HCC) 05/07/2017     Overview Note:     07/10/2016 PSA 6.9  09/11/2016 PSA 6.97  09/27/2016 - MRI prostate - results as follows:  1. No large focal lesion to suggest large volume, high-grade disease.  2. Several subcentimeter peripheral zone nodules, the largest of which   within the posterior left mid gland demonstrates marked restricted   diffusion and enhancement, most suggestive of small volume, high-grade   disease (PI-RADS 4). ?This is contoured in the Dynacad system as lesion 1.   Additional peripheral zone lesions are equivocal for high-grade disease   (PI-RADS 3).  3. Small ill-defined nodule in the left lateral prostate base, which is   indeterminate between a peripheral zone lesion or extruded transition zone   nodule and should be considered for high-grade disease (PI-RADS 4). This   is contoured in the Dynacad system as lesion 2.  4. Moderate nodular hyperplasia with several extruded nodules arising from   the transition zone (PI-RADS 2).    11/08/2016:  Targeted prostate biopsy:    One core showed Gleason grade 3+3=6 adenocarcinoma of the prostate involving 5% of needle core and measuring 1 mm in length    Labs:  [place] PSA Hx:  Lab Results   Component Value Date    PSA 3.20 11/25/2018    PSA 3.20 06/03/2018    PSA 5.92 11/05/2017    PSA 7.55 (H) 05/07/2017    PSA 6.97 (H) 09/11/2016    PSA 6.90 (H) 07/10/2016        ? Hemorrhoids 10/29/2016   ? Kidney stones 09/11/2016     Overview Note:            ? CAD (coronary artery disease) 07/10/2016   ? Microhematuria 07/10/2016     Overview Note:     -- tested in Urologist's office     ? Health care maintenance 07/10/2016     Overview Note:     Vaccines:   - Influenza: Fall 2019   - Pneumococcal: (Healthy patients at age 80 starting with PCV13 followed by PPSV23 1 year later)    - Prevnar 13: 10/2016    - Pneumovax 23: 06/2018; (<65 if chronic heart/lung/liver disease; or alcoholism/smoking/DM)   - Zoster: Due; (RZV: 2 doses 2-6 months apart for age >41)   - Tetanus: Due; (Q10years)   - Acellular pertussis: Due; (Once as an adult in Tdap form)  Screening:   - Colon Cancer: Last colonoscopy 03/2010 showed no polyps; (50-75: Q10 years unless abnormality; 75-85: individualized; > 85 don't screen)   - Prostate Cancer: Gleason 3+3 = 6 adenocarcinoma of the prostate. Last PSA 04/2017 7.55 (Q6M surveillance . Repeat biopsy  1 year per Uro-Onc)   - Lung Cancer: Non-smoker; (55-74 w/ either 30PY or quit < 15 years ago: Annual LDCT)   - Skin Cancer: No suspicious lesions   - DEXA: 01/2018 showed AP spin T score of 3.2 & mean femoral neck of 0.7. FRAX - PENDING; (Age 43; Fx < 50; RA; chronic steroid use)   - AAA: N/A, never smoker; (Age 67 if h/o smoking)   - ASCVD: Allergic to statins, on ezetimibe & niacin, & welchol; (Assess at least every 4-6 years depending on risk factors)   - DM:  Random glucose 110 10/2016; (Q3 years starting at age 59 OR anyone with: physical inactivity, HTN, +FMHx (1st), high risk race, low HDL/High TAG, h/o gestational DM, PCOS, PE w/ e/o insulin resistance, PVD)   - HIV: N/A, aged out   - Hep C: N/A, birth year       ? Hyperlipidemia      Overview Note:     Currently on [place] per Dr. Janyth Contes  Fax: [phone]     ? Hypertension    ? Angina pectoris (HCC)    ? 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 Allergic rhinitis 04/08/2013   ? Dysphagia 04/08/2013   ? Dysphonia 04/08/2013       Other concerns addressed at this visit -  Cervical radiculopathy    Records requested at the time of this visit:No    Prior consultations, labs, radiology reports reviewed at the time of this visit.Yes: Most recent visits from cardiology, urology, oncology            Depression:  Patient Scores:  PHQ-2: PHQ-2 Score: 0 (07/05/2020  9:16 AM)    PHQ-9: No data recorded  Interventions:  PHQ-2: PHQ-2 Score less than 3: No follow-up or recommendations are necessary at this time (07/05/2020  9:16 AM)    Depression Interventions PHQ-2/9: No data recorded  BMI:  Body mass index is 24.35 kg/m?Marland Kitchen  Plan in Progress: BMI Plan is in Progress (07/05/2020  9:15 AM)    Wt Readings from Last 10 Encounters:   07/05/20 81.6 kg (179 lb 12.8 oz)   03/22/20 78 kg (172 lb)   01/20/20 79.6 kg (175 lb 6.4 oz)   08/05/19 79.4 kg (175 lb)   07/01/19 82.6 kg (182 lb)   06/24/19 79.8 kg (176 lb)   06/11/19 79.8 kg (176 lb)   01/06/19 81.2 kg (179 lb)   12/09/18 80.2 kg (176 lb 12.8 oz)   07/24/18 80.3 kg (177 lb)       Falls:  Fall History (last 35mo): (!) One Fall without Major Injury (07/05/2020  9:16 AM)  Fall Risk: History of falls within the last 6 months (07/05/2020  9:16 AM)    Patient had one fall last month when he got up too quickly from a chair.  He states that this has happened in the past and then he remembers for several months to get up slowly but then forgets.  He is now going to return to standing up slowly from a seated position.                   Review of Systems   Constitutional: Negative for fever and malaise/fatigue.   HENT: Negative for congestion.    Eyes: Negative for blurred vision.   Cardiovascular: Negative for chest pain and dyspnea on exertion.   Respiratory: Negative for cough and shortness of breath.    Skin:  Negative for rash.   Musculoskeletal: Positive for joint pain. Negative for back pain.   Gastrointestinal: Negative for abdominal pain, constipation, diarrhea, heartburn and nausea.   Neurological: Positive for numbness. Negative for headaches.   Psychiatric/Behavioral: Negative for depression. The patient is not nervous/anxious.              Objective:         ? acetaminophen SR (TYLENOL ARTHRITIS PAIN) 650 mg tablet Take one tablet to two tablets by mouth every 12 hours as needed for Pain.   ? aspirin EC 81 mg tablet Take 81 mg by mouth daily.   ? cholecalciferol (VITAMIN D-3) 1,000 units tablet Take one tablet by mouth daily. Indications: vitamin D deficiency   ? clopiDOGrel (PLAVIX) 75 mg tablet Take 75 mg by mouth daily.   ? evolocumab (REPATHA) 140 mg/mL injectable SYRINGE Inject 140 mg under the skin every 14 days.   ? ezetimibe (ZETIA) 10 mg tablet Take 10 mg by mouth daily.   ? finasteride (PROSCAR) 5 mg tablet Take one tablet by mouth daily.   ? gabapentin (NEURONTIN) 100 mg capsule Take one capsule by mouth twice daily. Take one tab in AM and one tab qnoon.  Then 300mg  at night.  Start with just 300mg  tab at night for 1-2 weeks, then add the daytime tabs   ? gabapentin (NEURONTIN) 300 mg capsule Take one capsule by mouth at bedtime daily.   ? ketoconazole (NIZORAL) 2 % topical cream Apply  topically to affected area twice daily.   ? metoprolol tartrate (LOPRESSOR) 50 mg tablet Take 25 mg by mouth twice daily.   ? MULTIVITAMIN PO Take 1 tablet by mouth daily.   ? Niacin 500 mg cpER Take  by mouth.   ? nitroglycerin (NITROSTAT) 0.4 mg tablet Place 0.4 mg under tongue every 5 minutes as needed.   ? Omega-3 Acid Ethyl Esters 1 gram cap Take 1 g by mouth twice daily with meals.   ? senna/docusate (STOOL SOFTENER-STIMULANT LAXAT) 8.6/50 mg tablet Take 50 tablets by mouth daily. 50 mg tablet     Vitals:    07/05/20 0914   BP: (!) 152/80   Patient Position: Sitting   Pulse: 78   Resp: 16   Temp: 36.7 ?C (98 ?F)   Weight: 81.6 kg (179 lb 12.8 oz)   Height: 183 cm (6' 0.05)   PainSc: Two     Body mass index is 24.35 kg/m?Marland Kitchen   Vitals:    07/05/20 0914   BP: (!) 152/80   Patient Position: Sitting   Pulse: 78       Physical Exam  Vitals and nursing note reviewed.   Constitutional:       Appearance: Normal appearance.   HENT:      Head: Normocephalic and atraumatic.      Right Ear: Tympanic membrane, ear canal and external ear normal.      Left Ear: Tympanic membrane, ear canal and external ear normal.      Nose: Nose normal.      Mouth/Throat:      Mouth: Mucous membranes are moist.      Pharynx: Oropharynx is clear.   Eyes:      Extraocular Movements: Extraocular movements intact.      Pupils: Pupils are equal, round, and reactive to light.   Neck:      Vascular: No carotid bruit.      Comments: No thyromegaly  Cardiovascular:      Rate  and Rhythm: Normal rate and regular rhythm.      Pulses: Normal pulses.      Heart sounds: Normal heart sounds.   Pulmonary:      Effort: Pulmonary effort is normal.      Breath sounds: Normal breath sounds.   Abdominal:      General: Bowel sounds are normal.      Palpations: Abdomen is soft.   Musculoskeletal:         General: Normal range of motion.      Cervical back: Normal range of motion and neck supple.   Lymphadenopathy:      Cervical: No cervical adenopathy.   Skin:     General: Skin is warm and dry.      Findings: No rash.   Neurological: General: No focal deficit present.      Mental Status: He is alert and oriented to person, place, and time.   Psychiatric:         Mood and Affect: Mood normal.         Behavior: Behavior normal.         Health Maintenance   Topic Date Due   ? HEPATITIS C SCREENING  Never done   ? SHINGLES RECOMBINANT VACCINE (1 of 2) Never done   ? PHYSICAL (COMPREHENSIVE) EXAM  07/05/2021   ? MEDICARE ANNUAL WELLNESS VISIT  07/05/2021   ? DTAP/TDAP VACCINES (2 - Td or Tdap) 10/04/2029   ? COVID-19 VACCINE  Completed   ? INFLUENZA VACCINE  Completed   ? PNEUMONIA (PPSV23) VACCINE  Completed        The ASCVD Risk score Denman George DC Jr., et al., 2013) failed to calculate for the following reasons:    The valid total cholesterol range is 130 to 320 mg/dL        Assessment and Plan:    Timothy Sweeney was seen today for annual wellness visit, cholesterol and hypertension.    Diagnoses and all orders for this visit:    Medicare annual wellness visit, subsequent  -     CBC AND DIFF; Future; Expected date: 07/05/2020  -     COMPREHENSIVE METABOLIC PANEL; Future; Expected date: 07/05/2020  -     Cancel: LIPID PROFILE; Future; Expected date: 07/05/2020  -     TSH WITH FREE T4 REFLEX; Future; Expected date: 07/05/2020  -     HEPATITIS C ANTIBODY W REFLEX HCV PCR QUANT; Future; Expected date: 07/05/2020    Need for hepatitis C screening test  -     HEPATITIS C ANTIBODY W REFLEX HCV PCR QUANT; Future; Expected date: 07/05/2020    Prostate cancer [facility])    Coronary artery disease of native heart with stable angina pectoris, unspecified vessel or lesion type (HCC)  -     COMPREHENSIVE METABOLIC PANEL; Future; Expected date: 07/05/2020  -     Cancel: LIPID PROFILE; Future; Expected date: 07/05/2020    Annual physical exam  -     CBC AND DIFF; Future; Expected date: 07/05/2020  -     COMPREHENSIVE METABOLIC PANEL; Future; Expected date: 07/05/2020  -     Cancel: LIPID PROFILE; Future; Expected date: 07/05/2020  -     TSH WITH FREE T4 REFLEX; Future; Expected date: 07/05/2020  -     HEPATITIS C ANTIBODY W REFLEX HCV PCR QUANT; Future; Expected date: 07/05/2020    Cervical radiculopathy    Other orders  -     gabapentin (NEURONTIN) 300 mg capsule;  Take one capsule by mouth at bedtime daily.  -     gabapentin (NEURONTIN) 100 mg capsule; Take one capsule by mouth twice daily. Take one tab in AM and one tab qnoon.  Then 300mg  at night.  Start with just 300mg  tab at night for 1-2 weeks, then add the daytime tabs    Normal physical exam.  Patient to have labs today.    Patient follows with cardiology for coronary artery disease.  Continue current meds    Patient follows with urology and oncology for prostate cancer.  Patient has a repeat PSA due    For cervical radiculopathy, will trial gabapentin 300 mg at night and 100 mg twice daily.  If does not improve symptoms over a month, will stop.  Encounter Medications   Medications   ? gabapentin (NEURONTIN) 300 mg capsule     Sig: Take one capsule by mouth at bedtime daily.     Dispense:  30 capsule     Refill:  1   ? gabapentin (NEURONTIN) 100 mg capsule     Sig: Take one capsule by mouth twice daily. Take one tab in AM and one tab qnoon.  Then 300mg  at night.  Start with just 300mg  tab at night for 1-2 weeks, then add the daytime tabs     Dispense:  60 capsule     Refill:  1     Patient Instructions     Health Maintenance   Topic Date Due   ? HEPATITIS C SCREENING  Never done   ? SHINGLES RECOMBINANT VACCINE (1 of 2) Never done   ? PHYSICAL (COMPREHENSIVE) EXAM  07/05/2021   ? MEDICARE ANNUAL WELLNESS VISIT  07/05/2021   ? DTAP/TDAP VACCINES (2 - Td or Tdap) 10/04/2029   ? COVID-19 VACCINE  Completed   ? INFLUENZA VACCINE  Completed   ? PNEUMONIA (PPSV23) VACCINE  Completed         Visit Disposition     Dispositions    ? Return in about 1 year (around 07/05/2021) for Annual Physical.          Future Appointments   Date Time Provider Department Center   07/05/2020 11:20 AM [facility] None   07/24/2020 10:00 AM PHLEBOTOMIST IN LAB LEVEL 2 [facility] [place] Exam   07/26/2020 10:00 AM Tery Sanfilippo, APRN-NP [facility] [place] Exam   07/12/2021 11:00 AM Hiram Gash, MD [facility]     Return in about 1 year (around 07/05/2021) for Annual Physical.    I reviewed with the patient their current medications and specifically any new medications prescribed at the time of this visit and we reviewed the expected benefits and potential side effects. All questions are answered to the patient's satisfaction.    ? Health maintenance gaps were reviewed with the patient at the time of this visit.    ? I emphasized the importance of medication adherence.   ? The medical problems/diagnoses listed under assessment and plan were addressed at this visit and unless otherwise stated are adequately controlled.

## 2020-07-05 NOTE — Patient Instructions
Health Maintenance   Topic Date Due    HEPATITIS C SCREENING  Never done    SHINGLES RECOMBINANT VACCINE (1 of 2) Never done    PHYSICAL (COMPREHENSIVE) EXAM  07/05/2021    MEDICARE ANNUAL WELLNESS VISIT  07/05/2021    DTAP/TDAP VACCINES (2 - Td or Tdap) 10/04/2029    COVID-19 VACCINE  Completed    INFLUENZA VACCINE  Completed    PNEUMONIA (PPSV23) VACCINE  Completed

## 2020-07-26 ENCOUNTER — Encounter: Admit: 2020-07-26 | Discharge: 2020-07-26 | Payer: MEDICARE

## 2020-07-26 DIAGNOSIS — J302 Other seasonal allergic rhinitis: Secondary | ICD-10-CM

## 2020-07-26 DIAGNOSIS — I2510 Atherosclerotic heart disease of native coronary artery without angina pectoris: Secondary | ICD-10-CM

## 2020-07-26 DIAGNOSIS — R3129 Other microscopic hematuria: Secondary | ICD-10-CM

## 2020-07-26 DIAGNOSIS — I209 Angina pectoris, unspecified: Secondary | ICD-10-CM

## 2020-07-26 DIAGNOSIS — C61 Malignant neoplasm of prostate: Secondary | ICD-10-CM

## 2020-07-26 DIAGNOSIS — M1990 Unspecified osteoarthritis, unspecified site: Secondary | ICD-10-CM

## 2020-07-26 DIAGNOSIS — N200 Calculus of kidney: Secondary | ICD-10-CM

## 2020-07-26 DIAGNOSIS — R109 Unspecified abdominal pain: Secondary | ICD-10-CM

## 2020-07-26 DIAGNOSIS — I219 Acute myocardial infarction, unspecified: Secondary | ICD-10-CM

## 2020-07-26 DIAGNOSIS — N400 Enlarged prostate without lower urinary tract symptoms: Secondary | ICD-10-CM

## 2020-07-26 DIAGNOSIS — E785 Hyperlipidemia, unspecified: Secondary | ICD-10-CM

## 2020-07-26 DIAGNOSIS — I10 Essential (primary) hypertension: Secondary | ICD-10-CM

## 2020-07-26 NOTE — Assessment & Plan Note
I had the pleasure of visiting with Mr. Macioce in clinic today.  He has good urinary control, no new urinary complaints.  He feels that he empties his bladder after urination, he does have to double void,  typically after sleeping all night, urinating in the morning is little more frequent.  He describes his stream is a moderate full stream.  Most recent PSA was performed on 07/05/2020, PSA was 4.55 ng/mL, this is a stable value.  We will continue to monitor his PSA every 6 months.    He did have a recent MRI of the pelvis performed on 06/24/2019, MRI revealed prostate volume of 44 mL and 1 PI-RADS 3 and 1 PI-RADS 4 lesion.  He did undergo a prostate biopsy on 01/20/2020, pathology revealed benign prostatic tissue.  His initial diagnosis of grade Group 1 disease was in June 2018, he remains on active surveillance.     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    Plan:  1.  Return to clinic in 6 months with a PSA, he will see Dr. Jerline Pain

## 2020-07-26 NOTE — Progress Notes
Date of Service: 07/26/2020     Subjective:             Timothy Sweeney is a 80 y.o. male.    Chief Complaint   Patient presents with   ? Follow Up   ? Prostate Cancer       Cancer Staging  No matching staging information was found for the patient.    History of Present Illness  Timothy Sweeney is a very pleasant 80 year old male, with a history of prostate cancer.  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  Most recent PSA was performed on 07/05/2020, PSA was 4.55 ng/mL, stable value.     He did have an MRI of the pelvis performed on 09/27/2016, MRI revealed 2 PI-RADS 4 lesions, prostate volume of 64 mL.    He has had a recent MRI of the pelvis performed on 06/24/2019, MRI revealed prostate volume of 44 mL and 1 PI-RADS 3 and PI-RADS 4 lesion.  He did have a prostate biopsy performed on 01/20/2020, pathology revealed benign prostatic tissue.    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  He continues on his Plavix and low-dose aspirin which will be lifelong. He has been able to take tamsulosin due to orthostatic hypotension and was started on finasteride.  Unfortunately, his blood pressure and orthostatic hypotension are fluctuant and continue to occur.    He denies any urgency, states he urinates 3-4 times during the day.  He denies any nocturia.  He feels that he empties his bladder after urination, he does occasionally have to double void, especially after getting up in the morning.  He denies any hematuria or recent infections.  He describes his stream is a moderate full stream.  He denies any hesitancy or intermittent break in his stream.         Review of Systems   Constitutional: Negative for activity change, appetite change, chills, diaphoresis, fatigue, fever and unexpected weight change.   HENT: Negative for congestion, hearing loss, mouth sores, rhinorrhea, sinus pressure, sore throat and trouble swallowing.    Eyes: Negative for discharge and visual disturbance.   Respiratory: Negative for apnea, cough, chest tightness, shortness of breath and wheezing.    Cardiovascular: Negative for chest pain, palpitations and leg swelling.   Gastrointestinal: Negative for abdominal pain, blood in stool, constipation, diarrhea, nausea, rectal pain and vomiting.   Genitourinary: Negative for decreased urine volume, difficulty urinating, dysuria, enuresis, flank pain, frequency, genital sores, hematuria, penile discharge, penile pain, penile swelling, scrotal swelling, testicular pain and urgency.   Musculoskeletal: Negative for arthralgias, back pain, gait problem and myalgias.   Skin: Negative for rash and wound.   Neurological: Negative for dizziness, tremors, seizures, syncope, weakness, light-headedness, numbness and headaches.   Hematological: Negative for adenopathy. Does not bruise/bleed easily.   Psychiatric/Behavioral: Negative for agitation, behavioral problems, decreased concentration, dysphoric mood and sleep disturbance. The patient is not nervous/anxious.        Objective:         ? acetaminophen SR (TYLENOL ARTHRITIS PAIN) 650 mg tablet Take one tablet to two tablets by mouth every 12 hours as needed for Pain.   ? aspirin EC 81 mg tablet Take 81 mg by mouth daily.   ? cholecalciferol (VITAMIN D-3) 1,000 units tablet Take one tablet by mouth daily. Indications:  vitamin D deficiency   ? clopiDOGrel (PLAVIX) 75 mg tablet Take 75 mg by mouth daily.   ? evolocumab (REPATHA) 140 mg/mL injectable SYRINGE Inject 140 mg under the skin every 14 days.   ? ezetimibe (ZETIA) 10 mg tablet Take 10 mg by mouth daily.   ? finasteride (PROSCAR) 5 mg tablet Take one tablet by mouth daily.   ? gabapentin (NEURONTIN) 100 mg capsule Take one capsule by mouth twice daily. Take one tab in AM and one tab qnoon.  Then 300mg  at night.  Start with just 300mg  tab at night for 1-2 weeks, then add the daytime tabs   ? gabapentin (NEURONTIN) 300 mg capsule Take one capsule by mouth at bedtime daily.   ? ketoconazole (NIZORAL) 2 % topical cream Apply  topically to affected area twice daily.   ? metoprolol tartrate (LOPRESSOR) 50 mg tablet Take 25 mg by mouth twice daily.   ? MULTIVITAMIN PO Take 1 tablet by mouth daily.   ? Niacin 500 mg cpER Take  by mouth.   ? nitroglycerin (NITROSTAT) 0.4 mg tablet Place 0.4 mg under tongue every 5 minutes as needed.   ? Omega-3 Acid Ethyl Esters 1 gram cap Take 1 g by mouth twice daily with meals.   ? senna/docusate (STOOL SOFTENER-STIMULANT LAXAT) 8.6/50 mg tablet Take 50 tablets by mouth daily. 50 mg tablet       Vitals:    07/26/20 1015   BP: 135/85   Pulse: 72   Temp: 36.6 ?C (97.9 ?F)   TempSrc: Temporal   SpO2: 99%   Weight: 82.7 kg (182 lb 6.4 oz)   PainSc: Zero       Body mass index is 24.71 kg/m?.     Labs:  Many PSA Hx:  Lab Results   Component Value Date    PSA 4.55 07/05/2020    PSA 3.55 07/01/2019    PSA 3.55 06/11/2019    PSA 3.20 11/25/2018    PSA 3.20 06/03/2018    PSA 5.92 11/05/2017    PSA 7.55 (H) 05/07/2017    PSA 6.97 (H) 09/11/2016    PSA 6.90 (H) 07/10/2016         Physical Exam  Vitals reviewed.   Constitutional:       General: He is not in acute distress.     Appearance: Normal appearance. He is not ill-appearing.   HENT:      Head: Normocephalic and atraumatic.   Eyes:      General: No scleral icterus.     Extraocular Movements: Extraocular movements intact.      Conjunctiva/sclera: Conjunctivae normal.   Pulmonary:      Effort: Pulmonary effort is normal. No respiratory distress.   Abdominal:      General: There is no distension.      Palpations: Abdomen is soft.   Musculoskeletal:         General: No swelling. Normal range of motion.      Cervical back: Normal range of motion.   Skin:     General: Skin is warm and dry.   Neurological:      Mental Status: He is alert and oriented to person, place, and time.   Psychiatric: Mood and Affect: Mood normal.         Behavior: Behavior normal.         Thought Content: Thought content normal.         Judgment: Judgment normal.  Assessment and Plan:  Problem   Prostate Cancer (Hcc)    07/10/2016 PSA 6.9  09/11/2016 PSA 6.97  09/27/2016 - MRI prostate - results as follows:  1. No large focal lesion to suggest large volume, high-grade disease.  2. Several subcentimeter peripheral zone nodules, the largest of which   within the posterior left mid gland demonstrates marked restricted   diffusion and enhancement, most suggestive of small volume, high-grade   disease (PI-RADS 4). ?This is contoured in the Dynacad system as lesion 1.   Additional peripheral zone lesions are equivocal for high-grade disease   (PI-RADS 3).  3. Small ill-defined nodule in the left lateral prostate base, which is   indeterminate between a peripheral zone lesion or extruded transition zone   nodule and should be considered for high-grade disease (PI-RADS 4). This   is contoured in the Dynacad system as lesion 2.  4. Moderate nodular hyperplasia with several extruded nodules arising from   the transition zone (PI-RADS 2).    11/08/2016:  Targeted prostate biopsy:    One core showed Gleason grade 3+3=6 adenocarcinoma of the prostate involving 5% of needle core and measuring 1 mm in length    06/24/2019??MRI of the pelvis??prostate volume 44 mL, 1 PI-RADS 3 and 1 PI-RADS 4 lesion  01/20/2020??prostate biopsy, pathology revealed benign prostatic tissue    Labs:  [place] PSA Hx:  Lab Results   Component Value Date    PSA 4.55 07/05/2020    PSA 3.55 07/01/2019    PSA 3.55 06/11/2019    PSA 3.20 11/25/2018    PSA 3.20 06/03/2018    PSA 5.92 11/05/2017    PSA 7.55 (H) 05/07/2017    PSA 6.97 (H) 09/11/2016    PSA 6.90 (H) 07/10/2016            Prostate cancer (HCC)  I had the pleasure of visiting with Mr. Evrard in clinic today.  He has good urinary control, no new urinary complaints.  He feels that he empties his bladder after urination, he does have to double void,  typically after sleeping all night, urinating in the morning is little more frequent.  He describes his stream is a moderate full stream.  Most recent PSA was performed on 07/05/2020, PSA was 4.55 ng/mL, this is a stable value.  We will continue to monitor his PSA every 6 months.    He did have a recent MRI of the pelvis performed on 06/24/2019, MRI revealed prostate volume of 44 mL and 1 PI-RADS 3 and 1 PI-RADS 4 lesion.  He did undergo a prostate biopsy on 01/20/2020, pathology revealed benign prostatic tissue.  His initial diagnosis of grade Group 1 disease was in June 2018, he remains on active surveillance.     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    Plan:  1.  Return to clinic in 6 months with a PSA, he will see Dr. Jimmey Ralph    Orders Placed This Encounter   ? PROSTATIC SPECIFIC ANTIGEN-PSA       [place] Jetty, APRN-NP  Urology

## 2020-08-21 ENCOUNTER — Encounter: Admit: 2020-08-21 | Discharge: 2020-08-21 | Payer: MEDICARE

## 2020-08-21 MED ORDER — GABAPENTIN 300 MG PO CAP
300 mg | ORAL_CAPSULE | Freq: Every evening | ORAL | 1 refills | Status: AC
Start: 2020-08-21 — End: ?

## 2020-08-22 ENCOUNTER — Encounter: Admit: 2020-08-22 | Discharge: 2020-08-22 | Payer: MEDICARE

## 2020-08-22 MED ORDER — GABAPENTIN 100 MG PO CAP
ORAL_CAPSULE | 1 refills
Start: 2020-08-22 — End: ?

## 2020-08-30 ENCOUNTER — Encounter: Admit: 2020-08-30 | Discharge: 2020-08-30 | Payer: MEDICARE

## 2020-08-30 MED ORDER — GABAPENTIN 100 MG PO CAP
100 mg | ORAL_CAPSULE | Freq: Two times a day (BID) | ORAL | 0 refills | Status: AC
Start: 2020-08-30 — End: ?

## 2020-08-30 MED ORDER — GABAPENTIN 300 MG PO CAP
300 mg | ORAL_CAPSULE | Freq: Every evening | ORAL | 0 refills | Status: AC
Start: 2020-08-30 — End: ?

## 2020-10-02 ENCOUNTER — Encounter: Admit: 2020-10-02 | Discharge: 2020-10-02 | Payer: MEDICARE

## 2020-10-02 MED ORDER — FINASTERIDE 5 MG PO TAB
ORAL_TABLET | Freq: Every day | 3 refills
Start: 2020-10-02 — End: ?

## 2020-10-18 ENCOUNTER — Encounter: Admit: 2020-10-18 | Discharge: 2020-10-18 | Payer: MEDICARE

## 2020-10-18 MED ORDER — GABAPENTIN 300 MG PO CAP
ORAL_CAPSULE | Freq: Every evening | 3 refills | Status: AC
Start: 2020-10-18 — End: ?

## 2020-10-18 MED ORDER — GABAPENTIN 100 MG PO CAP
ORAL_CAPSULE | Freq: Two times a day (BID) | 3 refills | Status: AC
Start: 2020-10-18 — End: ?

## 2021-02-22 ENCOUNTER — Ambulatory Visit: Admit: 2021-02-22 | Discharge: 2021-02-22 | Payer: MEDICARE

## 2021-02-22 ENCOUNTER — Encounter: Admit: 2021-02-22 | Discharge: 2021-02-22 | Payer: MEDICARE

## 2021-02-22 DIAGNOSIS — C61 Malignant neoplasm of prostate: Secondary | ICD-10-CM

## 2021-02-22 LAB — PROSTATIC SPECIFIC ANTIGEN-PSA: PROSTATIC SPEC AG: 3.7 ng/mL (ref <6.01)

## 2021-02-28 NOTE — Progress Notes
Date of Service: 03/01/2021     Subjective:             Timothy Sweeney is a 80 y.o. male who presents for follow up.    History of Present Illness  80 year old man with history of CAD, prior MI, prior cardiac bypass, HTN and GG1 prostate cancer on active surveillance who presents for follow up. 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 His most recent MRI on 06/24/19 revealed prostate volume of 44 mL and 1 PI-RADS 3 and PI-RADS 4 lesion.  He did have a prostate biopsy performed on 01/20/2020, pathology revealed benign prostatic tissue. His most recent PSA was 4.55 on 07/05/20.     Patient reports he has been doing well since last seen. He continues on his Plavix and low-dose aspirin which will be lifelong given his cardiac history. He has not been able to take tamsulosin due to orthostatic hypotension, but continues on finasteride. His urinary symptoms are stable. PSA found to be 3.7 on 02/22/21.      Lab Results   Component Value Date/Time    PSA 3.70 02/22/2021 11:41 AM    PSA 4.55 07/05/2020 10:27 AM    PSA 3.55 07/01/2019 10:15 AM    PSA 3.55 06/11/2019 09:08 AM    PSA 3.20 11/25/2018 01:03 PM    PSA 3.20 06/03/2018 10:22 AM    PSA 5.92 11/05/2017 08:39 AM    PSA 7.55 (H) 05/07/2017 09:51 AM    PSA 6.97 (H) 09/11/2016 11:17 AM    PSA 6.90 (H) 07/10/2016 11:37 AM         Medical History:   Diagnosis Date   ? Angina pectoris (HCC)    ? Arthritis    ? Brain TIA    ? CAD (coronary artery disease) 07/10/2016   ? Enlarged prostate     by imaging   ? Flank pain, right 07/10/2016   ? Hyperlipidemia    ? Hypertension    ? Kidney stone     2 non-obstructing stones on imaging   ? Microhematuria 07/10/2016    -- tested in Urologist's office   ? Myocardial infarction (HCC) 1994, 1999, 2008    x10 stents   ? Prostate cancer [facility]) 05/07/2017    07/10/2016 PSA 6.9 09/11/2016 PSA 6.97 09/27/2016 - MRI prostate - results as follows: 1. No large focal lesion to suggest large volume, high-grade disease. 2. Several subcentimeter peripheral zone nodules, the largest of which  within the posterior left mid gland demonstrates marked restricted  diffusion and enhancement, most suggestive of small volume, high-grade  disease (PI-RADS 4). ?This is cont   ? Seasonal allergic reaction        Surgical History:   Procedure Laterality Date   ? HERNIA REPAIR Bilateral    ? PERCUTANEOUS CORONARY INTERVENTION      x4 with x10 stents       Family History   Problem Relation Age of Onset   ? Anemia Mother    ? Alzheimer's Mother    ? Cancer Father         MDS   ? Stroke Brother    ? Heart Disease Paternal Uncle    ? Melanoma Neg Hx        Current Outpatient Medications   Medication Sig Dispense Refill   ? acetaminophen SR (TYLENOL ARTHRITIS PAIN) 650 mg tablet Take one  tablet to two tablets by mouth every 12 hours as needed for Pain.  0   ? aspirin EC 81 mg tablet Take 81 mg by mouth daily.     ? cholecalciferol (VITAMIN D-3) 1,000 units tablet Take one tablet by mouth daily. Indications: vitamin D deficiency  0   ? clopiDOGrel (PLAVIX) 75 mg tablet Take 75 mg by mouth daily.     ? evolocumab (REPATHA) 140 mg/mL injectable SYRINGE Inject 140 mg under the skin every 14 days.     ? ezetimibe (ZETIA) 10 mg tablet Take 10 mg by mouth daily.     ? finasteride (PROSCAR) 5 mg tablet TAKE 1 TABLET BY MOUTH  DAILY 90 tablet 3   ? gabapentin (NEURONTIN) 100 mg capsule TAKE 1 CAPSULE BY MOUTH  TWICE DAILY IN THE MORNING  AND AT NOON, USE 300 MG  TABLET AT BEDTIME 180 capsule 3   ? gabapentin (NEURONTIN) 300 mg capsule TAKE 1 CAPSULE BY MOUTH AT  BEDTIME DAILY 90 capsule 3   ? ketoconazole (NIZORAL) 2 % topical cream Apply  topically to affected area twice daily. 60 g 3   ? metoprolol tartrate (LOPRESSOR) 50 mg tablet Take 25 mg by mouth twice daily.     ? MULTIVITAMIN PO Take 1 tablet by mouth daily.     ? Niacin 500 mg cpER Take  by mouth.     ? nitroglycerin (NITROSTAT) 0.4 mg tablet Place 0.4 mg under tongue every 5 minutes as needed.     ? Omega-3 Acid Ethyl Esters 1 gram cap Take 1 g by mouth twice daily with meals.     ? senna/docusate (SENOKOT-S) 8.6/50 mg tablet Take 50 tablets by mouth daily. 50 mg tablet       No current facility-administered medications for this visit.       Allergies   Allergen Reactions   ? Gadolinium-Containing Contrast Media RASH     2 inch rash on leg day after injection  Jake Bathe MD stated to put it down as a mild allergy   ? Statins-Hmg-Coa Reductase Inhibitors UNKNOWN       Social History     Socioeconomic History   ? Marital status: Married   ? Number of children: 5   Occupational History     Employer: [employer]   Tobacco Use   ? Smoking status: Never Smoker   ? Smokeless tobacco: Never Used   Substance and Sexual Activity   ? Alcohol use: No   ? Drug use: No       Review of Systems   Constitutional: Negative for activity change, appetite change, chills, fatigue, fever and unexpected weight change.   Respiratory: Negative for chest tightness and shortness of breath.    Cardiovascular: Negative for chest pain and palpitations.   Gastrointestinal: Negative for abdominal pain.   Genitourinary: Negative for difficulty urinating, hematuria and urgency.   Skin: Negative for color change, rash and wound.   Neurological: Negative for dizziness.   Psychiatric/Behavioral: Negative for agitation and behavioral problems.       Objective:         ? acetaminophen SR (TYLENOL ARTHRITIS PAIN) 650 mg tablet Take one tablet to two tablets by mouth every 12 hours as needed for Pain.   ? aspirin EC 81 mg tablet Take 81 mg by mouth daily.   ? cholecalciferol (VITAMIN D-3) 1,000 units tablet Take one tablet by mouth daily. Indications: vitamin D deficiency   ? clopiDOGrel (  PLAVIX) 75 mg tablet Take 75 mg by mouth daily.   ? evolocumab (REPATHA) 140 mg/mL injectable SYRINGE Inject 140 mg under the skin every 14 days.   ? ezetimibe (ZETIA) 10 mg tablet Take 10 mg by mouth daily.   ? finasteride (PROSCAR) 5 mg tablet TAKE 1 TABLET BY MOUTH  DAILY   ? gabapentin (NEURONTIN) 100 mg capsule TAKE 1 CAPSULE BY MOUTH  TWICE DAILY IN THE MORNING  AND AT NOON, USE 300 MG  TABLET AT BEDTIME   ? gabapentin (NEURONTIN) 300 mg capsule TAKE 1 CAPSULE BY MOUTH AT  BEDTIME DAILY   ? ketoconazole (NIZORAL) 2 % topical cream Apply  topically to affected area twice daily.   ? metoprolol tartrate (LOPRESSOR) 50 mg tablet Take 25 mg by mouth twice daily.   ? MULTIVITAMIN PO Take 1 tablet by mouth daily.   ? Niacin 500 mg cpER Take  by mouth.   ? nitroglycerin (NITROSTAT) 0.4 mg tablet Place 0.4 mg under tongue every 5 minutes as needed.   ? Omega-3 Acid Ethyl Esters 1 gram cap Take 1 g by mouth twice daily with meals.   ? senna/docusate (SENOKOT-S) 8.6/50 mg tablet Take 50 tablets by mouth daily. 50 mg tablet     Vitals:    03/01/21 1053 03/01/21 1055   BP: 132/68    BP Source: Arm, Left Upper    Pulse: 70    Temp: 36.3 ?C (97.3 ?F)    Resp: 18    SpO2: 100%    TempSrc: Temporal    PainSc: Zero Zero   Weight: 78.9 kg (174 lb)    Height: 183 cm (6' 0.05)      Body mass index is 23.57 kg/m?Marland Kitchen     Physical Exam  Constitutional:       Appearance: Normal appearance. He is well-developed.   HENT:      Head: Normocephalic and atraumatic.   Eyes:      Pupils: Pupils are equal, round, and reactive to light.   Pulmonary:      Effort: Pulmonary effort is normal. No respiratory distress.   Abdominal:      Palpations: Abdomen is soft.      Tenderness: There is no abdominal tenderness.   Musculoskeletal:         General: Normal range of motion.      Cervical back: Normal range of motion and neck supple.   Skin:     General: Skin is warm and dry.   Neurological:      Mental Status: He is alert and oriented to person, place, and time.   Psychiatric:         Behavior: Behavior normal.         Thought Content: Thought content normal.         Judgment: Judgment normal.            Assessment and Plan:    Problem   Prostate Cancer (Hcc)    07/10/2016 PSA 6.9  09/11/2016 PSA 6.97  09/27/2016 - MRI prostate - results as follows:  1. No large focal lesion to suggest large volume, high-grade disease.  2. Several subcentimeter peripheral zone nodules, the largest of which   within the posterior left mid gland demonstrates marked restricted   diffusion and enhancement, most suggestive of small volume, high-grade   disease (PI-RADS 4). ?This is contoured in the Dynacad system as lesion 1.   Additional peripheral zone lesions are equivocal for high-grade disease   (  PI-RADS 3).  3. Small ill-defined nodule in the left lateral prostate base, which is   indeterminate between a peripheral zone lesion or extruded transition zone   nodule and should be considered for high-grade disease (PI-RADS 4). This   is contoured in the Dynacad system as lesion 2.  4. Moderate nodular hyperplasia with several extruded nodules arising from   the transition zone (PI-RADS 2).    11/08/2016:  Targeted prostate biopsy:    One core showed Gleason grade 3+3=6 adenocarcinoma of the prostate involving 5% of needle core and measuring 1 mm in length    06/24/2019--MRI of the pelvis--prostate volume 44 mL, 1 PI-RADS 3 and 1 PI-RADS 4 lesion  01/20/2020--prostate biopsy, pathology revealed benign prostatic tissue    Labs:  [place] PSA Hx:  Lab Results   Component Value Date    PSA 3.70 02/22/2021    PSA 4.55 07/05/2020    PSA 3.55 07/01/2019    PSA 3.55 06/11/2019    PSA 3.20 11/25/2018    PSA 3.20 06/03/2018    PSA 5.92 11/05/2017    PSA 7.55 (H) 05/07/2017    PSA 6.97 (H) 09/11/2016    PSA 6.90 (H) 07/10/2016          Prostate cancer (HCC)  80 year old man with history of CAD, prior MI, prior cardiac bypass, HTN and GG1 prostate cancer on active surveillance who presents for follow up. PSA found to be stable/decreasedd to 3.7 (from 4.5). His PSA remains stable. Continued active surveillance vs transitioning to observation/watchful waiting given his significant cardiac history and stable PSA was discussed. At this time, the patient feels it would be appropriate to proceed with watchful waiting     - RTC 1 year with PSA prior     Patient seen and discussed with Dr. Jimmey Ralph, who directed plan of care.    Jane Canary, MD  PGY-4 Urology    ATTESTATION    I personally performed the key portions of the E/M visit, discussed case with resident and concur with resident documentation of history, physical exam, assessment, and treatment plan unless otherwise noted.     Timothy Sweeney is doing well from a prostate cancer perspective,  His PSA remains stable and he has had no changes in urinary symptoms.  At this time I recommended that we consider transitioning to watchful waiting given his cardiac disease.  I recommended a return in 1 year with a PSA.      Myna Hidalgo, MD  Urologic Oncology  Department of Urology      Staff name:  Myna Hidalgo, MD Date:  03/01/2021

## 2021-03-01 ENCOUNTER — Encounter: Admit: 2021-03-01 | Discharge: 2021-03-01 | Payer: MEDICARE

## 2021-03-01 DIAGNOSIS — I219 Acute myocardial infarction, unspecified: Secondary | ICD-10-CM

## 2021-03-01 DIAGNOSIS — I2510 Atherosclerotic heart disease of native coronary artery without angina pectoris: Secondary | ICD-10-CM

## 2021-03-01 DIAGNOSIS — M1990 Unspecified osteoarthritis, unspecified site: Secondary | ICD-10-CM

## 2021-03-01 DIAGNOSIS — C61 Malignant neoplasm of prostate: Secondary | ICD-10-CM

## 2021-03-01 DIAGNOSIS — G459 Transient cerebral ischemic attack, unspecified: Secondary | ICD-10-CM

## 2021-03-01 DIAGNOSIS — I209 Angina pectoris, unspecified: Secondary | ICD-10-CM

## 2021-03-01 DIAGNOSIS — N200 Calculus of kidney: Secondary | ICD-10-CM

## 2021-03-01 DIAGNOSIS — E785 Hyperlipidemia, unspecified: Secondary | ICD-10-CM

## 2021-03-01 DIAGNOSIS — I10 Essential (primary) hypertension: Secondary | ICD-10-CM

## 2021-03-01 DIAGNOSIS — R3129 Other microscopic hematuria: Secondary | ICD-10-CM

## 2021-03-01 DIAGNOSIS — N400 Enlarged prostate without lower urinary tract symptoms: Secondary | ICD-10-CM

## 2021-03-01 DIAGNOSIS — R109 Unspecified abdominal pain: Secondary | ICD-10-CM

## 2021-03-01 DIAGNOSIS — J302 Other seasonal allergic rhinitis: Secondary | ICD-10-CM

## 2021-03-29 ENCOUNTER — Encounter: Admit: 2021-03-29 | Discharge: 2021-03-29 | Payer: MEDICARE

## 2021-08-07 ENCOUNTER — Encounter: Admit: 2021-08-07 | Discharge: 2021-08-07 | Payer: MEDICARE

## 2021-08-07 ENCOUNTER — Ambulatory Visit: Admit: 2021-08-07 | Discharge: 2021-08-08 | Payer: MEDICARE

## 2021-08-07 VITALS — Temp 98.30000°F

## 2021-08-07 VITALS — BP 136/78 | HR 70 | Temp 98.10000°F | Resp 12 | Ht 72.047 in | Wt 175.0 lb

## 2021-08-07 DIAGNOSIS — C61 Malignant neoplasm of prostate: Secondary | ICD-10-CM

## 2021-08-07 DIAGNOSIS — I2510 Atherosclerotic heart disease of native coronary artery without angina pectoris: Secondary | ICD-10-CM

## 2021-08-07 DIAGNOSIS — I10 Essential (primary) hypertension: Secondary | ICD-10-CM

## 2021-08-07 DIAGNOSIS — I219 Acute myocardial infarction, unspecified: Secondary | ICD-10-CM

## 2021-08-07 DIAGNOSIS — J302 Other seasonal allergic rhinitis: Secondary | ICD-10-CM

## 2021-08-07 DIAGNOSIS — R109 Unspecified abdominal pain: Secondary | ICD-10-CM

## 2021-08-07 DIAGNOSIS — R3129 Other microscopic hematuria: Secondary | ICD-10-CM

## 2021-08-07 DIAGNOSIS — N200 Calculus of kidney: Secondary | ICD-10-CM

## 2021-08-07 DIAGNOSIS — R55 Syncope and collapse: Secondary | ICD-10-CM

## 2021-08-07 DIAGNOSIS — N400 Enlarged prostate without lower urinary tract symptoms: Secondary | ICD-10-CM

## 2021-08-07 DIAGNOSIS — E785 Hyperlipidemia, unspecified: Secondary | ICD-10-CM

## 2021-08-07 DIAGNOSIS — M1990 Unspecified osteoarthritis, unspecified site: Secondary | ICD-10-CM

## 2021-08-07 DIAGNOSIS — G459 Transient cerebral ischemic attack, unspecified: Secondary | ICD-10-CM

## 2021-08-07 DIAGNOSIS — I209 Angina pectoris, unspecified: Secondary | ICD-10-CM

## 2021-08-07 DIAGNOSIS — I25118 Atherosclerotic heart disease of native coronary artery with other forms of angina pectoris: Secondary | ICD-10-CM

## 2021-08-07 NOTE — Patient Instructions
Health Maintenance   Topic Date Due    SHINGLES RECOMBINANT VACCINE (1 of 2) Never done    PHYSICAL (COMPREHENSIVE) EXAM  08/08/2022    MEDICARE ANNUAL WELLNESS VISIT  08/08/2022    DTAP/TDAP VACCINES (2 - Td or Tdap) 10/04/2029    COVID-19 VACCINE  Completed    PNEUMOCOCCAL VACCINE  Completed    HEPATITIS C SCREENING  Completed    DEPRESSION SCREENING  Completed    INFLUENZA VACCINE  Completed    ADVANCED CARE PLANNING DISCUSSION AND DOCUMENTATION  Completed

## 2021-08-08 DIAGNOSIS — Z0000 Encounter for general adult medical examination without abnormal findings: Secondary | ICD-10-CM

## 2021-08-08 DIAGNOSIS — E785 Hyperlipidemia, unspecified: Principal | ICD-10-CM

## 2021-09-20 ENCOUNTER — Encounter: Admit: 2021-09-20 | Discharge: 2021-09-20 | Payer: MEDICARE

## 2021-09-25 ENCOUNTER — Encounter: Admit: 2021-09-25 | Discharge: 2021-09-25 | Payer: MEDICARE

## 2021-09-25 MED ORDER — FINASTERIDE 5 MG PO TAB
ORAL_TABLET | 3 refills | Status: AC
Start: 2021-09-25 — End: ?

## 2022-02-26 ENCOUNTER — Encounter: Admit: 2022-02-26 | Discharge: 2022-02-26 | Payer: MEDICARE

## 2022-02-28 ENCOUNTER — Encounter: Admit: 2022-02-28 | Discharge: 2022-02-28 | Payer: MEDICARE

## 2022-02-28 DIAGNOSIS — I209 Angina pectoris, unspecified: Secondary | ICD-10-CM

## 2022-02-28 DIAGNOSIS — J302 Other seasonal allergic rhinitis: Secondary | ICD-10-CM

## 2022-02-28 DIAGNOSIS — C61 Malignant neoplasm of prostate: Secondary | ICD-10-CM

## 2022-02-28 DIAGNOSIS — I10 Essential (primary) hypertension: Secondary | ICD-10-CM

## 2022-02-28 DIAGNOSIS — I2510 Atherosclerotic heart disease of native coronary artery without angina pectoris: Secondary | ICD-10-CM

## 2022-02-28 DIAGNOSIS — R109 Unspecified abdominal pain: Secondary | ICD-10-CM

## 2022-02-28 DIAGNOSIS — N200 Calculus of kidney: Secondary | ICD-10-CM

## 2022-02-28 DIAGNOSIS — M1990 Unspecified osteoarthritis, unspecified site: Secondary | ICD-10-CM

## 2022-02-28 DIAGNOSIS — I219 Acute myocardial infarction, unspecified: Secondary | ICD-10-CM

## 2022-02-28 DIAGNOSIS — R3129 Other microscopic hematuria: Secondary | ICD-10-CM

## 2022-02-28 DIAGNOSIS — E785 Hyperlipidemia, unspecified: Secondary | ICD-10-CM

## 2022-02-28 DIAGNOSIS — N400 Enlarged prostate without lower urinary tract symptoms: Secondary | ICD-10-CM

## 2022-02-28 DIAGNOSIS — G459 Transient cerebral ischemic attack, unspecified: Secondary | ICD-10-CM

## 2022-02-28 LAB — PROSTATIC SPECIFIC ANTIGEN-PSA: PROSTATIC SPEC AG: 4.9 ng/mL (ref <6.01)

## 2022-02-28 NOTE — Progress Notes
Date of Service: 02/28/2022     Subjective:             Timothy Sweeney is a 81 y.o. male who presents for follow up     History of Present Illness  Timothy Sweeney is a 81 y.o. 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 His most recent MRI on 06/24/19 revealed prostate volume of 44 mL and 1 PI-RADS 3 and PI-RADS 4 lesion. ?He did have a prostate biopsy performed on 01/20/2020, pathology revealed benign prostatic tissue. His most recent PSA was 4.55 on 07/05/20. At his last visit, the patient was transitioned from active surveillance to watchful waiting.    Today, he is doing well with no major concerns. He continues to urinate without any symptoms. He continues on finasteride. He does not have a PSA today.            PMHx:  Medical History:   Diagnosis Date   ? Angina pectoris (HCC)    ? Arthritis    ? Brain TIA    ? CAD (coronary artery disease) 07/10/2016   ? Enlarged prostate     by imaging   ? Flank pain, right 07/10/2016   ? Hyperlipidemia    ? Hypertension    ? Kidney stone     2 non-obstructing stones on imaging   ? Microhematuria 07/10/2016    -- tested in Urologist's office   ? Myocardial infarction (HCC) 1994, 1999, 2008    x10 stents   ? Prostate cancer [facility]) 05/07/2017    07/10/2016 PSA 6.9 09/11/2016 PSA 6.97 09/27/2016 - MRI prostate - results as follows: 1. No large focal lesion to suggest large volume, high-grade disease. 2. Several subcentimeter peripheral zone nodules, the largest of which  within the posterior left mid gland demonstrates marked restricted  diffusion and enhancement, most suggestive of small volume, high-grade  disease (PI-RADS 4). ?This is cont   ? Seasonal allergic reaction        SurgHx:  Surgical History:   Procedure Laterality Date   ? CATARACT REMOVAL Bilateral    ? HERNIA REPAIR Bilateral    ? PERCUTANEOUS CORONARY INTERVENTION      x4 with x10 stents       Allergies:    Allergies   Allergen Reactions   ? Gadolinium-Containing Contrast Media RASH     2 inch rash on leg day after injection  Jake Bathe MD stated to put it down as a mild allergy   ? Statins-Hmg-Coa Reductase Inhibitors UNKNOWN       SocHx:  Social History     Socioeconomic History   ? Marital status: Married   ? Number of children: 5   Occupational History     Employer: [employer]   Tobacco Use   ? Smoking status: Never   ? Smokeless tobacco: Never   Substance and Sexual Activity   ? Alcohol use: No   ? Drug use: No        FamHx:  Family History   Problem Relation Age of Onset   ? Anemia Mother    ? Alzheimer's Mother    ? Cancer Father         MDS   ? Stroke Brother    ?  Heart Disease Paternal Uncle    ? Melanoma Neg Hx        Objective:         ? acetaminophen SR (TYLENOL ARTHRITIS PAIN) 650 mg tablet Take one tablet to two tablets by mouth every 12 hours as needed for Pain.   ? aspirin EC 81 mg tablet Take one tablet by mouth daily.   ? cholecalciferol (VITAMIN D-3) 1,000 units tablet Take one tablet by mouth daily. Indications: vitamin D deficiency   ? clopiDOGrel (PLAVIX) 75 mg tablet Take one tablet by mouth daily.   ? evolocumab (REPATHA) 140 mg/mL injectable SYRINGE Inject 1 mL under the skin every 14 days.   ? ezetimibe (ZETIA) 10 mg tablet Take one tablet by mouth daily.   ? finasteride (PROSCAR) 5 mg tablet TAKE 1 TABLET BY MOUTH  DAILY   ? gabapentin (NEURONTIN) 100 mg capsule TAKE 1 CAPSULE BY MOUTH  TWICE DAILY IN THE MORNING  AND AT NOON, USE 300 MG  TABLET AT BEDTIME   ? gabapentin (NEURONTIN) 300 mg capsule TAKE 1 CAPSULE BY MOUTH AT  BEDTIME DAILY   ? ibuprofen (ADVIL) 200 mg tablet Take one tablet by mouth every 6 hours as needed for Pain. Take with food.   ? ketoconazole (NIZORAL) 2 % topical cream Apply  topically to affected area twice daily.   ? metoprolol tartrate (LOPRESSOR) 50 mg tablet Take one-half tablet by mouth twice daily.   ? MULTIVITAMIN PO Take 1 tablet by mouth daily.   ? Niacin 500 mg cpER Take  by mouth.   ? nitroglycerin (NITROSTAT) 0.4 mg tablet Place one tablet under tongue every 5 minutes as needed.   ? Omega-3 Acid Ethyl Esters 1 gram cap Take one capsule by mouth twice daily with meals.   ? senna/docusate (SENOKOT-S) 8.6/50 mg tablet Take fifty tablets by mouth daily. 50 mg tablet     Vitals:    02/28/22 1045   BP: 125/67   BP Source: Arm, Left Upper   Pulse: 65   Temp: 36.6 ?C (97.9 ?F)   Resp: 16   SpO2: 98%   TempSrc: Temporal   PainSc: Zero   Weight: 77.8 kg (171 lb 9.6 oz)     Body mass index is 23.24 kg/m?Marland Kitchen     Physical Exam  Constitutional:       Appearance: He is well-developed.   HENT:      Head: Normocephalic and atraumatic.   Eyes:      Conjunctiva/sclera: Conjunctivae normal.   Pulmonary:      Effort: Pulmonary effort is normal. No respiratory distress.   Abdominal:      General: There is no distension.      Palpations: Abdomen is soft.      Tenderness: There is no abdominal tenderness.   Musculoskeletal:         General: Normal range of motion.      Cervical back: Normal range of motion and neck supple.   Skin:     General: Skin is warm and dry.   Neurological:      Mental Status: He is alert and oriented to person, place, and time.   Psychiatric:         Behavior: Behavior normal.              Assessment and Plan:  Timothy Sweeney is a 81 y.o. 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 His most recent MRI on 06/24/19 revealed prostate volume of 44 mL and 1 PI-RADS 3 and PI-RADS 4 lesion. ?He did have a prostate biopsy performed on 01/20/2020, pathology revealed benign prostatic tissue. His most recent PSA was 4.55 on 07/05/20. At his last visit, the patient was transitioned from active surveillance to watchful waiting.    He is doing well today. We will plan to obtain a PSA today and have him return in one year with repeat PSA.    Plan:  - PSA today  - Return to clinic in one year with repeat PSA            Patient seen and discussed with Dr. Jimmey Ralph, who directed plan of care.    Venancio Poisson, MD  PGY-4 Urology    Orders Placed This Encounter   ? PROSTATIC SPECIFIC ANTIGEN-PSA     ATTESTATION    I personally performed the key portions of the E/M visit, discussed case with resident and concur with resident documentation of history, physical exam, assessment, and treatment plan unless otherwise noted.    Staff name:  Myna Hidalgo, MD Date: 02/28/2022

## 2022-07-30 ENCOUNTER — Encounter: Admit: 2022-07-30 | Discharge: 2022-07-30 | Payer: MEDICARE

## 2022-08-08 ENCOUNTER — Encounter: Admit: 2022-08-08 | Discharge: 2022-08-08 | Payer: MEDICARE

## 2022-08-08 ENCOUNTER — Ambulatory Visit: Admit: 2022-08-08 | Discharge: 2022-08-08 | Payer: MEDICARE

## 2022-08-08 DIAGNOSIS — E785 Hyperlipidemia, unspecified: Secondary | ICD-10-CM

## 2022-08-08 DIAGNOSIS — M1990 Unspecified osteoarthritis, unspecified site: Secondary | ICD-10-CM

## 2022-08-08 DIAGNOSIS — C61 Malignant neoplasm of prostate: Secondary | ICD-10-CM

## 2022-08-08 DIAGNOSIS — G459 Transient cerebral ischemic attack, unspecified: Secondary | ICD-10-CM

## 2022-08-08 DIAGNOSIS — R3129 Other microscopic hematuria: Secondary | ICD-10-CM

## 2022-08-08 DIAGNOSIS — Z0000 Encounter for general adult medical examination without abnormal findings: Secondary | ICD-10-CM

## 2022-08-08 DIAGNOSIS — I209 Angina pectoris, unspecified: Secondary | ICD-10-CM

## 2022-08-08 DIAGNOSIS — N200 Calculus of kidney: Secondary | ICD-10-CM

## 2022-08-08 DIAGNOSIS — I10 Essential (primary) hypertension: Secondary | ICD-10-CM

## 2022-08-08 DIAGNOSIS — J302 Other seasonal allergic rhinitis: Secondary | ICD-10-CM

## 2022-08-08 DIAGNOSIS — I219 Acute myocardial infarction, unspecified: Secondary | ICD-10-CM

## 2022-08-08 DIAGNOSIS — N400 Enlarged prostate without lower urinary tract symptoms: Secondary | ICD-10-CM

## 2022-08-08 DIAGNOSIS — F5101 Primary insomnia: Secondary | ICD-10-CM

## 2022-08-08 DIAGNOSIS — I25118 Atherosclerotic heart disease of native coronary artery with other forms of angina pectoris: Secondary | ICD-10-CM

## 2022-08-08 DIAGNOSIS — I2510 Atherosclerotic heart disease of native coronary artery without angina pectoris: Secondary | ICD-10-CM

## 2022-08-08 DIAGNOSIS — R109 Unspecified abdominal pain: Secondary | ICD-10-CM

## 2022-08-08 LAB — TSH WITH FREE T4 REFLEX: TSH: 3.8 uU/mL (ref 0.35–5.00)

## 2022-08-08 LAB — COMPREHENSIVE METABOLIC PANEL
GLUCOSE,PANEL: 107 mg/dL — ABNORMAL HIGH (ref 70–100)
POTASSIUM: 4.4 MMOL/L (ref 3.5–5.1)
SODIUM: 141 MMOL/L (ref 137–147)

## 2022-08-08 LAB — CBC
HEMATOCRIT: 43 % (ref 40–50)
HEMOGLOBIN: 14 g/dL — ABNORMAL HIGH (ref 13.5–16.5)
MCH: 30 pg (ref 26–34)
MCHC: 33 g/dL (ref 32.0–36.0)
MCV: 91 FL (ref 80–100)
MPV: 9.8 FL (ref >60)
PLATELET COUNT: 189 K/UL (ref 150–400)
RBC COUNT: 4.7 M/UL (ref <100)
RDW: 14 % (ref 11–15)
WBC COUNT: 5.8 K/UL (ref >40)

## 2022-08-08 LAB — LIPID PROFILE
CHOLESTEROL: 108 mg/dL (ref <200)
TRIGLYCERIDES: 120 mg/dL (ref <150)

## 2022-08-08 MED ORDER — TRAZODONE 50 MG PO TAB
50 mg | ORAL_TABLET | Freq: Every evening | ORAL | 0 refills | Status: AC | PRN
Start: 2022-08-08 — End: ?

## 2022-08-08 NOTE — Progress Notes
Date of Service: 08/08/2022    Timothy Sweeney is a 82 y.o. male.  DOB: 1940-11-15  MRN: [id number]     Subjective:            He presents today for an Annual Medicare Wellness visit.    he was last seen by me 08/07/2021.  Patient states overall he is feeling well.  He denies any anxiety or depression.  He walks regularly.  He is having difficulty sleeping and states that he used to take a Tylenol PM but this made him feel groggy.  Now he just takes a Tylenol at night.  He states he wakes up after about 5 or 6 hours of sleep.  He does not feel well rested but he gets up anyway and he is able to go about his day.  Sometimes he does take a nap.  Patient also has some joint pain but only takes 2 Tylenol's at night.              Chief Complaint   Patient presents with    Annual Wellness Visit     Labs done  Sleep trouble- only sleep about 5hrs and wake up       Medical History:   Diagnosis Date    Angina pectoris (HCC)     Arthritis     Brain TIA     CAD (coronary artery disease) 07/10/2016    Enlarged prostate     by imaging    Flank pain, right 07/10/2016    Hyperlipidemia     Hypertension     Kidney Bashir Marchetti     2 non-obstructing stones on imaging    Microhematuria 07/10/2016    -- tested in Urologist's office    Myocardial infarction (HCC) 1994, 1999, 2008    x10 stents    Prostate cancer (HCC) 05/07/2017    07/10/2016 PSA 6.9 09/11/2016 PSA 6.97 09/27/2016 - MRI prostate - results as follows: 1. No large focal lesion to suggest large volume, high-grade disease. 2. Several subcentimeter peripheral zone nodules, the largest of which  within the posterior left mid gland demonstrates marked restricted  diffusion and enhancement, most suggestive of small volume, high-grade  disease (PI-RADS 4).  This is cont    Seasonal allergic reaction      Surgical History:   Procedure Laterality Date    CATARACT REMOVAL Bilateral     HERNIA REPAIR Bilateral     PERCUTANEOUS CORONARY INTERVENTION      x4 with x10 stents     Family History   Problem Relation Age of Onset    Anemia Mother     Alzheimer's Mother     Cancer Father         MDS    Stroke Brother     Heart Disease Paternal Uncle     Melanoma Neg Hx      Social History     Socioeconomic History    Marital status: Married    Number of children: 5   Occupational History     Employer: [employer]   Tobacco Use    Smoking status: Never    Smokeless tobacco: Never   Substance and Sexual Activity    Alcohol use: No    Drug use: No      I reviewed medications, allergies, problem list and tobacco history at this visit.    A Health Risk assessment was performed by the patient today, reviewed with the patient.  Health Risk Assessment Questionnaire  Current Care  List of Providers you have seen in the last two years: Dr. Parker;Dr. Rigdon;Dr. Fransisco Beau , Dr. Q;Dr. Emory Leaver;Dr. Hollis;Dr. Dorene Grebe  Are you receiving home health?: No  During the past 4 weeks, how would you rate your health in general?: Good    Outside Care  Since your last PCP visit, have you received care outside of [facility]?: (!) Yes  What type of care did you receive outside of [facility]? (select all that apply): (!) Specialty Visit  What is the Facility where you received care and the provider's name?: [facility];Loop recorder removal    Physical Activity  Do you exercise or are you physically active?: Yes  How many days a week do you usually exercise or are physically active?: 5  On days when you exercised or were physically active, how many minutes was the activity?: 45  During the past four weeks, what was the hardest physical activity you could do for at least two minutes?: Heavy    Diet  In the past month, were you worried whether your food would run out before you or your family had money to buy more?: No  In the past 7 days, how many times did you eat fast food or junk food or pizza?: 1  In the past 7 days, how many servings of fruits or vegetables did you eat each day?: (!) 2-3  In the past 7 days, how many sodas and sugar sweetened drinks (regular, not diet) did you drink each day?: 1    Smoke/Tobacco Use  Are you currently a smoker?: No      Alcohol Use  Do you drink alcohol?: No          Depression Screen  Little interest or pleasure in doing things: Not at All  Feeling down, depressed or hopeless: Not at All    Patient Scores:  PHQ-2: PHQ-2 Score: 0 (08/06/2022 10:59 AM)    PHQ-9: No data recorded  Interventions:  PHQ-2: No data recorded  Depression Interventions PHQ-2/9: No data recorded      Pain  How would you rate your pain today?: (!) Mild pain    Ambulation  Do you use any assistive devices for ambulation?: No      Fall Risk  Does it take you longer than 30 seconds to get up and out of a chair?: No  Have you fallen in the past year?: No      Motor Vehicle Safety  Do you fasten your seat belt when you are in the car?: Yes    Sun Exposure  Do you protect yourself from the sun? For example, wear sunscreen when outside.: (!) No    Hearing Loss  Do you have trouble hearing the television or radio when others do not?: No  Do you have to strain or struggle to hear/understand conversation?: No  Do you use hearing aids?: No    Cognitive Impairment  During the past 12 months, have you experienced confusion or memory loss that is happening more often or is getting worse?: No    Functional Screen  Do you live alone?: No  Do you live at: Home  Can you drive your own car or travel alone by bus or taxi?: Yes  Can you shop for groceries or clothes without help?: Yes  Can you prepare your own meals?: Yes  Can you do your own  housework without help?: Yes  Can you handle your own money without help?: Yes  Do you need help eating, bathing, dressing, or getting around your home?: No  Do you feel safe?: Yes  Does anyone at home hurt you, hit you, or threaten you?: No  Have you ever been the victim of abuse?: No    Home Safety  Does your home have grab bars in the bathroom?: (!) No  Does your home have hand rails on stairs and steps?: Yes  Does your home have functioning smoke alarms?: Yes    Advance Directive  Do you have a living will or Advance Directive?: Yes      Dental Screen  Have you had an exam by your dentist in the last year?: Yes    Vision Screen  Do you have diabetes?: No          In the last 12 months, has your utility company shut off your service for not paying your bills?: No (08/06/2022 10:29 AM)  Are you worried that in the next 2 months, you may not have stable housing?: No (08/06/2022 10:29 AM)  Are you afraid that you may be hurt in your home by someone you know?: No (08/06/2022 10:29 AM)  Are you afraid you might be hurt in your apartment building or neighborhood?: No (08/06/2022 10:29 AM)  Do problems getting childcare make it difficult to work or study?: No (08/06/2022 10:29 AM)  In the last 12 months, have you needed to see a doctor, but could not because of cost?: No (08/06/2022 10:29 AM)  In the last 12 months, did you skip medications to save money?: No (08/06/2022 10:29 AM)  In the past 12 months, have you ever gone without  health care because you didn't have a way to get there?: No (08/06/2022 10:29 AM)  Do you have problems understanding what is told to you about your medical conditions?: No (08/06/2022 10:29 AM)  Do you often feel that you lack companionship?: No (08/06/2022 10:29 AM)  If you answered yes to any questions above, would you like to discuss help with your social work team?: No (08/06/2022 10:29 AM)      Personal prevention Plan reviewed with patient.    While the patient was here today, due to his/her multiple chronic conditions it would be in the best interest of the patient for me to monitor, assess and evaluate those as well. They are as follows.  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 No large focal lesion to suggest large volume, high-grade disease.  2. Several subcentimeter peripheral zone nodules, the largest of which   within the posterior left mid gland demonstrates marked restricted   diffusion and enhancement, most suggestive of small volume, high-grade   disease (PI-RADS 4).  This is contoured in the Dynacad system as lesion 1.   Additional peripheral zone lesions are equivocal for high-grade disease   (PI-RADS 3).  3. Small ill-defined nodule in the left lateral prostate base, which is   indeterminate between a peripheral zone lesion or extruded transition zone   nodule and should be considered for high-grade disease (PI-RADS 4). This   is contoured in the Dynacad system as lesion 2.  4. Moderate nodular hyperplasia with several extruded nodules arising from   the transition zone (PI-RADS 2).    11/08/2016:  Targeted prostate biopsy:    One core showed Gleason grade 3+3=6 adenocarcinoma of the prostate involving 5% of needle core and measuring 1 mm in length    06/24/2019--MRI of the pelvis--prostate volume 44 mL, 1 PI-RADS 3 and 1 PI-RADS 4 lesion  01/20/2020--prostate biopsy, pathology revealed benign prostatic tissue    Labs:  [facility] PSA Hx:  Lab Results   Component Value Date    PSA 3.70 02/22/2021    PSA 4.55 07/05/2020    PSA 3.55 07/01/2019    PSA 3.55 06/11/2019    PSA 3.20 11/25/2018    PSA 3.20 06/03/2018    PSA 5.92 11/05/2017    PSA 7.55 (H) 05/07/2017    PSA 6.97 (H) 09/11/2016    PSA 6.90 (H) 07/10/2016         Hemorrhoids 10/29/2016    Kidney stones 09/11/2016     Overview Note:             CAD (coronary artery disease) 07/10/2016    Microhematuria 07/10/2016     Overview Note:     -- tested in Urologist's office      Health care maintenance 07/10/2016     Overview Note:     Vaccines:   - Influenza: Fall 2019   - Pneumococcal: (Healthy patients at age 97 starting with PCV13 followed by PPSV23 1 year later)    - Prevnar 13: 10/2016    - Pneumovax 23: 06/2018; (<65 if chronic heart/lung/liver disease; or alcoholism/smoking/DM)   - Zoster: Due; (RZV: 2 doses 2-6 months apart for age >36)   - Tetanus: Due; (Q10years)   - Acellular pertussis: Due; (Once as an adult in Tdap form)  Screening:   - Colon Cancer: Last colonoscopy 03/2010 showed no polyps; (50-75: Q10 years unless abnormality; 75-85: individualized; > 85 don't screen)   - Prostate Cancer: Gleason 3+3 = 6 adenocarcinoma of the prostate. Last PSA 04/2017 7.55 (Q6M surveillance . Repeat biopsy 1 year per Uro-Onc)   - Lung Cancer: Non-smoker; (55-74 w/ either 30PY or quit < 15 years ago: Annual LDCT)   - Skin Cancer: No suspicious lesions   - DEXA: 01/2018 showed AP spin T score of 3.2 & mean femoral neck of 0.7. FRAX - PENDING; (Age 41; Fx < 50; RA; chronic steroid use)   - AAA: N/A, never smoker; (Age 60 if h/o  smoking)   - ASCVD: Allergic to statins, on ezetimibe & niacin, & welchol; (Assess at least every 4-6 years depending on risk factors)   - DM:  Random glucose 110 10/2016; (Q3 years starting at age 26 OR anyone with: physical inactivity, HTN, +FMHx (1st), high risk race, low HDL/High TAG, h/o gestational DM, PCOS, PE w/ e/o insulin resistance, PVD)   - HIV: N/A, aged out   - Hep C: N/A, birth year        Hyperlipidemia      Overview Note:     Currently on Rapatha per Dr. 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    Allergic rhinitis 04/08/2013    Dysphagia 04/08/2013    Dysphonia 04/08/2013       Other concerns addressed at this visit -  Insomnia, joint pain    Records requested at the time of this visit:No    Prior consultations, labs, radiology reports reviewed at the time of this visit.Yes: Most recent labs            Depression:  Patient Scores:  PHQ-2: PHQ-2 Score: 0 (08/06/2022 10:59 AM)    PHQ-9: No data recorded  Interventions:  PHQ-2: No data recorded  Depression Interventions PHQ-2/9: No data recorded  BMI:  Body mass index is 23.05 kg/m?Marland Kitchen  No data recorded  Wt Readings from Last 10 Encounters:   08/08/22 79.7 kg (175 lb 12.8 oz)   02/28/22 77.8 kg (171 lb 9.6 oz)   08/07/21 79.4 kg (175 lb)   03/01/21 78.9 kg (174 lb)   07/26/20 82.7 kg (182 lb 6.4 oz)   07/05/20 81.6 kg (179 lb 12.8 oz)   03/22/20 78 kg (172 lb)   01/20/20 79.6 kg (175 lb 6.4 oz)   08/05/19 79.4 kg (175 lb)   07/01/19 82.6 kg (182 lb)       Falls:  Fall History (last 73mo): No Falls (02/28/2022 10:45 AM)  Fall Risk: None identified (02/28/2022 10:45 AM)                     Review of Systems   Constitutional: Negative for fever and malaise/fatigue.   HENT:  Negative for congestion.    Eyes:  Negative for blurred vision.   Cardiovascular:  Negative for chest pain and dyspnea on exertion.   Respiratory:  Negative for cough and shortness of breath.    Skin:  Negative for rash.   Musculoskeletal:  Positive for joint pain. Negative for back pain.   Gastrointestinal:  Negative for abdominal pain, constipation, diarrhea and nausea.   Neurological:  Negative for headaches.   Psychiatric/Behavioral:  Negative for depression. The patient has insomnia. The patient is not nervous/anxious.              Objective:          acetaminophen SR (TYLENOL ARTHRITIS PAIN) 650 mg tablet Take one tablet to two tablets by mouth every 12 hours as needed for Pain.    aspirin EC 81 mg tablet Take one tablet by mouth daily.    cholecalciferol (VITAMIN D-3) 1,000 units tablet Take one tablet by mouth daily. Indications: vitamin D deficiency    clopiDOGrel (PLAVIX) 75 mg tablet Take one tablet by mouth daily.    evolocumab (REPATHA) 140 mg/mL injectable SYRINGE Inject 1 mL under the skin every 14 days.  ezetimibe (ZETIA) 10 mg tablet Take one tablet by mouth daily.    finasteride (PROSCAR) 5 mg tablet TAKE 1 TABLET BY MOUTH  DAILY    ibuprofen (ADVIL) 200 mg tablet Take one tablet by mouth every 6 hours as needed for Pain. Take with food.    metoprolol tartrate (LOPRESSOR) 50 mg tablet Take one tablet by mouth twice daily.    MULTIVITAMIN PO Take 1 tablet by mouth daily.    Niacin 500 mg cpER Take  by mouth.    nitroglycerin (NITROSTAT) 0.4 mg tablet Place one tablet under tongue every 5 minutes as needed.    Omega-3 Acid Ethyl Esters 1 gram cap Take one capsule by mouth twice daily with meals.    senna/docusate (SENOKOT-S) 8.6/50 mg tablet Take one tablet by mouth daily. 50 mg tablet    traZODone (DESYREL) 50 mg tablet Take one tablet by mouth at bedtime as needed for Sleep.     Vitals:    08/08/22 0959   BP: 120/66   BP Source: Arm, Left Upper   Pulse: 64   Temp: 36.7 ?C (98.1 ?F)   TempSrc: Temporal   PainSc: Zero   Weight: 79.7 kg (175 lb 12.8 oz)   Height: 186 cm (6' 1.23)     Body mass index is 23.05 kg/m?Marland Kitchen   Vitals:    08/08/22 0959   BP: 120/66   BP Source: Arm, Left Upper   Patient Position: Sitting   Pulse: 64       Physical Exam  Vitals and nursing note reviewed.   Constitutional:       Appearance: Normal appearance.   HENT:      Head: Normocephalic and atraumatic.      Right Ear: Tympanic membrane, ear canal and external ear normal.      Left Ear: Tympanic membrane, ear canal and external ear normal.      Mouth/Throat:      Mouth: Mucous membranes are moist.      Pharynx: Oropharynx is clear.   Eyes:      Extraocular Movements: Extraocular movements intact.      Pupils: Pupils are equal, round, and reactive to light.   Cardiovascular:      Rate and Rhythm: Normal rate and regular rhythm.      Pulses: Normal pulses.   Pulmonary:      Effort: Pulmonary effort is normal.      Breath sounds: Normal breath sounds.   Musculoskeletal:      Cervical back: Neck supple.      Right lower leg: No edema.      Left lower leg: No edema.   Lymphadenopathy:      Cervical: No cervical adenopathy.   Skin:     General: Skin is warm and dry.      Findings: No rash.   Neurological:      General: No focal deficit present.      Mental Status: He is alert and oriented to person, place, and time.   Psychiatric:         Mood and Affect: Mood normal.         Behavior: Behavior normal.         Health Maintenance   Topic Date Due    SHINGLES RECOMBINANT VACCINE (1 of 2) Never done    RSV VACCINE (60 YEARS AND OLDER) (1 - 1-dose 60+ series) Never done    INFLUENZA VACCINE (1) 12/18/2021    COVID-19 VACCINE (6 - 2023-24  season) 01/18/2022    MEDICARE ANNUAL WELLNESS VISIT  08/08/2023    ADVANCE CARE PLANNING DISCUSSION AND DOCUMENTATION  08/08/2023    DTAP/TDAP VACCINES (2 - Td or Tdap) 10/04/2029    PNEUMOCOCCAL VACCINE 65+ YRS  Completed    HEPATITIS C SCREENING  Completed    DEPRESSION SCREENING  Completed        The ASCVD Risk score (Arnett DK, et al., 2019) failed to calculate for the following reasons:    Cannot find a previous HDL lab    Cannot find a previous total cholesterol lab        Assessment and Plan:    Timothy Sweeney was seen today for annual wellness visit.    Diagnoses and all orders for this visit:    Encounter for Medicare annual wellness exam    Prostate cancer [facility])    Coronary artery disease of native heart with stable angina pectoris, unspecified vessel or lesion type (HCC)    Primary hypertension    Hyperlipidemia, unspecified hyperlipidemia type    Arthritis    Primary insomnia    Other orders  -     traZODone (DESYREL) 50 mg tablet; Take one tablet by mouth at bedtime as needed for Sleep.    Reviewed HRA.  Patient is up-to-date on age-appropriate screening.    Prostate cancer on active surveillance with urology.      Coronary artery disease that is stable.  Continue current meds.  Blood pressure is well-controlled.  Patient follows with cardiology    Hypertension controlled.  Continue current meds    Hyperlipidemia controlled.  Continue current meds    Arthritis uncontrolled.  Recommend patient take an additional Tylenol in the morning and sometimes at noon.    Insomnia uncontrolled.  Will try trazodone 50 to 100 mg nightly to see if this is effective.  Also recommend good sleep hygiene    Depression screening for 1 minute.  Patient is not depressed  Encounter Medications   Medications    traZODone (DESYREL) 50 mg tablet     Sig: Take one tablet by mouth at bedtime as needed for Sleep.     Dispense:  30 tablet     Refill:  0     Patient Instructions     Health Maintenance   Topic Date Due    SHINGLES RECOMBINANT VACCINE (1 of 2) Never done    RSV VACCINE (60 YEARS AND OLDER) (1 - 1-dose 60+ series) Never done    INFLUENZA VACCINE (1) 12/18/2021    COVID-19 VACCINE (6 - 2023-24 season) 01/18/2022    MEDICARE ANNUAL WELLNESS VISIT  08/08/2023    ADVANCE CARE PLANNING DISCUSSION AND DOCUMENTATION  08/08/2023    DTAP/TDAP VACCINES (2 - Td or Tdap) 10/04/2029    PNEUMOCOCCAL VACCINE 65+ YRS  Completed    HEPATITIS C SCREENING  Completed    DEPRESSION SCREENING  Completed      Visit Disposition       Dispositions    Return in about 1 year (around 08/08/2023) for Physical-March.            Future Appointments   Date Time Provider Department Center   02/24/2023 11:15 AM [facility] RESOURCE [facility] None   02/27/2023 11:15 AM Myna Hidalgo, MD [id number] [place] [facility]   08/07/2023 10:30 AM Hiram Gash, MD [email or web address]     Return in about 1 year (around 08/08/2023) for Physical-March.    I reviewed with the patient their  current medications and specifically any new medications prescribed at the time of this visit and we reviewed the expected benefits and potential side effects. All questions are answered to the patient's satisfaction.    Health maintenance gaps were reviewed with the patient at the time of this visit.    I emphasized the importance of medication adherence.   The medical problems/diagnoses listed under assessment and plan were addressed at this visit and unless otherwise stated are adequately controlled.                Problem   Primary Insomnia   Arthritis

## 2022-08-08 NOTE — Patient Instructions
Health Maintenance   Topic Date Due    SHINGLES RECOMBINANT VACCINE (1 of 2) Never done    RSV VACCINE (60 YEARS AND OLDER) (1 - 1-dose 60+ series) Never done    INFLUENZA VACCINE (1) 12/18/2021    COVID-19 VACCINE (6 - 2023-24 season) 01/18/2022    MEDICARE ANNUAL WELLNESS VISIT  08/08/2023    ADVANCE CARE PLANNING DISCUSSION AND DOCUMENTATION  08/08/2023    DTAP/TDAP VACCINES (2 - Td or Tdap) 10/04/2029    PNEUMOCOCCAL VACCINE 65+ YRS  Completed    HEPATITIS C SCREENING  Completed    DEPRESSION SCREENING  Completed

## 2022-09-02 ENCOUNTER — Encounter: Admit: 2022-09-02 | Discharge: 2022-09-02 | Payer: MEDICARE

## 2022-09-02 MED ORDER — TRAZODONE 100 MG PO TAB
100 mg | ORAL_TABLET | Freq: Every evening | ORAL | 3 refills | Status: AC | PRN
Start: 2022-09-02 — End: ?

## 2022-09-12 ENCOUNTER — Encounter: Admit: 2022-09-12 | Discharge: 2022-09-12 | Payer: MEDICARE

## 2022-09-12 MED ORDER — FINASTERIDE 5 MG PO TAB
ORAL_TABLET | 3 refills | Status: AC
Start: 2022-09-12 — End: ?

## 2023-01-21 ENCOUNTER — Encounter: Admit: 2023-01-21 | Discharge: 2023-01-21 | Payer: MEDICARE

## 2023-01-30 ENCOUNTER — Encounter: Admit: 2023-01-30 | Discharge: 2023-01-30 | Payer: MEDICARE

## 2023-01-30 ENCOUNTER — Ambulatory Visit: Admit: 2023-01-30 | Discharge: 2023-01-30 | Payer: MEDICARE

## 2023-01-30 DIAGNOSIS — C61 Malignant neoplasm of prostate: Secondary | ICD-10-CM

## 2023-01-30 LAB — PROSTATIC SPECIFIC ANTIGEN-PSA: PROSTATIC SPEC AG: 5.5 ng/mL (ref <6.01)

## 2023-02-27 ENCOUNTER — Encounter: Admit: 2023-02-27 | Discharge: 2023-02-27 | Payer: MEDICARE

## 2023-02-27 DIAGNOSIS — C61 Malignant neoplasm of prostate: Secondary | ICD-10-CM

## 2023-02-27 DIAGNOSIS — I10 Essential (primary) hypertension: Secondary | ICD-10-CM

## 2023-02-27 DIAGNOSIS — E785 Hyperlipidemia, unspecified: Secondary | ICD-10-CM

## 2023-02-27 DIAGNOSIS — M1990 Unspecified osteoarthritis, unspecified site: Secondary | ICD-10-CM

## 2023-02-27 DIAGNOSIS — N200 Calculus of kidney: Secondary | ICD-10-CM

## 2023-02-27 DIAGNOSIS — I2510 Atherosclerotic heart disease of native coronary artery without angina pectoris: Secondary | ICD-10-CM

## 2023-02-27 DIAGNOSIS — R109 Unspecified abdominal pain: Secondary | ICD-10-CM

## 2023-02-27 DIAGNOSIS — I219 Acute myocardial infarction, unspecified: Secondary | ICD-10-CM

## 2023-02-27 DIAGNOSIS — R3129 Other microscopic hematuria: Secondary | ICD-10-CM

## 2023-02-27 DIAGNOSIS — I209 Angina pectoris, unspecified: Secondary | ICD-10-CM

## 2023-02-27 DIAGNOSIS — N400 Enlarged prostate without lower urinary tract symptoms: Secondary | ICD-10-CM

## 2023-02-27 DIAGNOSIS — J302 Other seasonal allergic rhinitis: Secondary | ICD-10-CM

## 2023-02-27 DIAGNOSIS — G459 Transient cerebral ischemic attack, unspecified: Secondary | ICD-10-CM

## 2023-02-27 NOTE — Assessment & Plan Note
I had the pleasure of visiting with Timothy Sweeney in clinic today.  He has good urinary control.  He does feel that he empties his bladder after urination.  He describes his stream is a moderate full stream.  Most recent PSA was performed on 02/09/2023, PSA was 5.59 ng/ml, increased from previous value.  Current PSA density is 0.13.    It is advised we will continue to monitor his PSA, he would like to continue with a watchful waiting.  Will have him return in 1 year with a PSA.    Plan:  1.  Return to clinic in 1 year with a PSA

## 2023-02-27 NOTE — Progress Notes
Date of Service: 02/27/2023     Subjective:             Timothy Sweeney is a 82 y.o. male.    Chief Complaint   Patient presents with    Follow Up    Prostate Cancer        Cancer Staging   No matching staging information was found for the patient.      History of Present Illness  Timothy Sweeney is a pleasant 82 year old male, with a history of CAD, prior MI, prior cardiac bypass, hypertension and grade group 1 prostate cancer, he was on active surveillance.  He has transition to watchful waiting.    Patient was initially diagnosed with prostate cancer on 10/2016, pathology revealed 1 of 14 cores, positive for prostatic adenocarcinoma, grade group 1 disease.  Most recent MRI was performed on 02/0/2021, revealed prostate volume of 44 mL and a PI-RADS 3 and a PI-RADS 4 lesion.  He did have a prostate biopsy performed on 01/20/2020, pathology revealed benign prostatic tissue.    Most recent PSA was performed on 02/09/2023, PSA was 5.59 ng/ml, increased from previous value.    He denies any urgency, states urinates 4 times throughout the day.  He has occasional nocturia x 1.  He feels that he empties his bladder after urination.  He denies any hematuria or recent infections.  He describes his stream is a moderate full stream.  He denies any hesitancy or intermittent break in his stream.  He does continue to take finasteride 5 mg daily.             Review of Systems   Constitutional:  Negative for activity change, appetite change, chills, diaphoresis, fatigue, fever and unexpected weight change.   HENT:  Negative for congestion, hearing loss, mouth sores, rhinorrhea, sinus pressure, sore throat and trouble swallowing.    Eyes:  Negative for discharge and visual disturbance.   Respiratory:  Negative for apnea, cough, chest tightness, shortness of breath and wheezing.    Cardiovascular:  Negative for chest pain, palpitations and leg swelling.   Gastrointestinal:  Negative for abdominal pain, blood in stool, constipation, diarrhea, nausea, rectal pain and vomiting.   Genitourinary:  Negative for decreased urine volume, difficulty urinating, dysuria, enuresis, flank pain, frequency, genital sores, hematuria, penile discharge, penile pain, penile swelling, scrotal swelling, testicular pain and urgency.   Musculoskeletal:  Negative for arthralgias, back pain, gait problem and myalgias.   Skin:  Negative for rash and wound.   Neurological:  Negative for dizziness, tremors, seizures, syncope, weakness, light-headedness, numbness and headaches.   Hematological:  Negative for adenopathy. Does not bruise/bleed easily.   Psychiatric/Behavioral:  Negative for agitation, behavioral problems, decreased concentration, dysphoric mood and sleep disturbance. The patient is not nervous/anxious.        Objective:          acetaminophen SR (TYLENOL ARTHRITIS PAIN) 650 mg tablet Take one tablet to two tablets by mouth every 12 hours as needed for Pain.    aspirin EC 81 mg tablet Take one tablet by mouth daily.    cholecalciferol (VITAMIN D-3) 1,000 units tablet Take one tablet by mouth daily. Indications: vitamin D deficiency    clopiDOGrel (PLAVIX) 75 mg tablet Take one tablet by mouth daily.    evolocumab (REPATHA) 140 mg/mL injectable SYRINGE Inject 1 mL under the skin every 14 days.    ezetimibe (ZETIA) 10 mg tablet Take one tablet by mouth daily.    finasteride (  PROSCAR) 5 mg tablet TAKE 1 TABLET BY MOUTH DAILY    ibuprofen (ADVIL) 200 mg tablet Take one tablet by mouth every 6 hours as needed for Pain. Take with food.    metoprolol tartrate (LOPRESSOR) 50 mg tablet Take one tablet by mouth twice daily.    MULTIVITAMIN PO Take 1 tablet by mouth daily.    Niacin 500 mg cpER Take  by mouth.    nitroglycerin (NITROSTAT) 0.4 mg tablet Place one tablet under tongue every 5 minutes as needed.    Omega-3 Acid Ethyl Esters 1 gram cap Take one capsule by mouth twice daily with meals.    senna/docusate (SENOKOT-S) 8.6/50 mg tablet Take one tablet by mouth daily. 50 mg tablet    traZODone (DESYREL) 100 mg tablet Take one tablet by mouth at bedtime as needed for Sleep.       Vitals:    02/27/23 1137   BP: (!) 142/74   BP Source: Arm, Left Upper   Pulse: 64   Temp: 36.5 ?C (97.7 ?F)   Resp: 18   SpO2: 99%   TempSrc: Temporal   PainSc: Zero   Weight: 74.9 kg (165 lb 3.2 oz)   Height: 185.4 cm (6' 1)       Body mass index is 21.8 kg/m?Marland Kitchen       Physical Exam  Vitals reviewed.   Constitutional:       General: He is not in acute distress.     Appearance: Normal appearance. He is not ill-appearing.   HENT:      Head: Normocephalic and atraumatic.   Eyes:      General: No scleral icterus.     Extraocular Movements: Extraocular movements intact.      Conjunctiva/sclera: Conjunctivae normal.   Pulmonary:      Effort: Pulmonary effort is normal. No respiratory distress.   Abdominal:      General: There is no distension.      Palpations: Abdomen is soft.   Musculoskeletal:         General: No swelling. Normal range of motion.      Cervical back: Normal range of motion.   Skin:     General: Skin is warm and dry.   Neurological:      Mental Status: He is alert and oriented to person, place, and time.   Psychiatric:         Mood and Affect: Mood normal.         Behavior: Behavior normal.         Thought Content: Thought content normal.         Judgment: Judgment normal.             Assessment and Plan:  Problem   Prostate Cancer (Hcc)    07/10/2016 PSA 6.9  09/11/2016 PSA 6.97  09/27/2016 - MRI prostate - results as follows:  1. No large focal lesion to suggest large volume, high-grade disease.  2. Several subcentimeter peripheral zone nodules, the largest of which   within the posterior left mid gland demonstrates marked restricted   diffusion and enhancement, most suggestive of small volume, high-grade   disease (PI-RADS 4).  This is contoured in the Dynacad system as lesion 1.   Additional peripheral zone lesions are equivocal for high-grade disease   (PI-RADS 3).  3. Small ill-defined nodule in the left lateral prostate base, which is   indeterminate between a peripheral zone lesion or extruded transition zone   nodule and should  be considered for high-grade disease (PI-RADS 4). This   is contoured in the Dynacad system as lesion 2.  4. Moderate nodular hyperplasia with several extruded nodules arising from   the transition zone (PI-RADS 2).    11/08/2016:  Targeted prostate biopsy:    One core showed Gleason grade 3+3=6 adenocarcinoma of the prostate involving 5% of needle core and measuring 1 mm in length    06/24/2019--MRI of the pelvis--prostate volume 44 mL, 1 PI-RADS 3 and 1 PI-RADS 4 lesion  01/20/2020--prostate biopsy, pathology revealed benign prostatic tissue    Labs:  [facility] PSA Hx:  Lab Results   Component Value Date    PSA 3.70 02/22/2021    PSA 4.55 07/05/2020    PSA 3.55 07/01/2019    PSA 3.55 06/11/2019    PSA 3.20 11/25/2018    PSA 3.20 06/03/2018    PSA 5.92 11/05/2017    PSA 7.55 (H) 05/07/2017    PSA 6.97 (H) 09/11/2016    PSA 6.90 (H) 07/10/2016            Prostate cancer (HCC)  I had the pleasure of visiting with Timothy Sweeney in clinic today.  He has good urinary control.  He does feel that he empties his bladder after urination.  He describes his stream is a moderate full stream.  Most recent PSA was performed on 02/09/2023, PSA was 5.59 ng/ml, increased from previous value.  Current PSA density is 0.13.    It is advised we will continue to monitor his PSA, he would like to continue with a watchful waiting.  Will have him return in 1 year with a PSA.    Plan:  1.  Return to clinic in 1 year with a PSA    Orders Placed This Encounter    PROSTATIC SPECIFIC ANTIGEN-PSA       [place] Jetty, APRN-NP  Urology       ATTESTATION    I personally interviewed and examined the patient.  I have reviewed the history, physical, impression and plan outlined by the Nurse Practitioner.    The patient presents with (HPI) a history of prostate cancer on active surveillance.  He was originally diagnosed in 2018 with grade group 1 disease.  His most recent biopsy was in 2021 and was all benign.  He then transitioned to watchful waiting,  On examination there is a well appearing man in no acute distress.  His PSA is mildly increased to 5.59 (on finasteride),   My impression is that he has prostate cancer on watchful waiting,  My plan is to continue with watchful waiting and he will return in 1 year with a PSA.    Staff name:  Myna Hidalgo, MD Date: 02/27/2023

## 2023-07-18 ENCOUNTER — Encounter: Admit: 2023-07-18 | Discharge: 2023-07-18 | Payer: MEDICARE

## 2023-07-22 ENCOUNTER — Encounter: Admit: 2023-07-22 | Discharge: 2023-07-22 | Payer: MEDICARE

## 2023-08-08 ENCOUNTER — Encounter: Admit: 2023-08-08 | Discharge: 2023-08-08 | Payer: MEDICARE

## 2023-08-08 ENCOUNTER — Ambulatory Visit: Admit: 2023-08-08 | Discharge: 2023-08-09 | Payer: MEDICARE

## 2023-08-09 ENCOUNTER — Encounter: Admit: 2023-08-09 | Discharge: 2023-08-09 | Payer: MEDICARE

## 2023-08-12 ENCOUNTER — Ambulatory Visit: Admit: 2023-08-12 | Discharge: 2023-08-13 | Payer: MEDICARE

## 2023-08-12 ENCOUNTER — Encounter: Admit: 2023-08-12 | Discharge: 2023-08-12 | Payer: MEDICARE

## 2023-09-28 ENCOUNTER — Encounter: Admit: 2023-09-28 | Discharge: 2023-09-28 | Payer: MEDICARE

## 2024-02-26 ENCOUNTER — Encounter: Admit: 2024-02-26 | Discharge: 2024-02-26 | Payer: MEDICARE

## 2024-02-26 ENCOUNTER — Ambulatory Visit: Admit: 2024-02-26 | Discharge: 2024-02-27 | Payer: MEDICARE

## 2024-03-04 ENCOUNTER — Encounter: Admit: 2024-03-04 | Discharge: 2024-03-04 | Payer: MEDICARE

## 2024-03-04 VITALS — BP 148/70 | HR 64 | Temp 98.30000°F | Ht 73.0 in | Wt 168.4 lb

## 2024-03-04 DIAGNOSIS — C61 Malignant neoplasm of prostate: Principal | ICD-10-CM

## 2024-03-04 NOTE — Progress Notes
 Name: Timothy Sweeney          MRN: [id number]      DOB: 1941/05/07      AGE: 83 y.o.   DATE OF SERVICE: 03/04/2024    Subjective:             Reason for Visit:  Follow Up      Timothy Sweeney is a 83 y.o. male.      Cancer Staging   No matching staging information was found for the patient.      History of Present Illness  Timothy Sweeney is a 83 y.o. male who has a history of CAD, prior MI, prior cardiac bypass, hypertension and grade group 1 prostate cancer, he was on active surveillance. He has transition to watchful waiting.     Patient was initially diagnosed with prostate cancer on 10/2016, pathology revealed 1 of 14 cores, positive for prostatic adenocarcinoma, grade group 1 disease. Most recent MRI was performed on 02/0/2021, revealed prostate volume of 44 mL and a PI-RADS 3 and a PI-RADS 4 lesion. He did have a prostate biopsy performed on 01/20/2020, pathology revealed benign prostatic tissue.     He reports good urinary control.  He denies any health changes other than some muscle mass loss.      More recently (June 2025) he lost his wife.         Review of Systems   Constitutional:  Positive for fatigue.   HENT: Negative.     Eyes: Negative.    Respiratory: Negative.     Cardiovascular: Negative.    Gastrointestinal: Negative.    Endocrine: Negative.    Genitourinary: Negative.    Musculoskeletal: Negative.    Skin: Negative.    Allergic/Immunologic: Negative.    Neurological: Negative.    Hematological: Negative.    Psychiatric/Behavioral: Negative.           Objective:          acetaminophen  SR (TYLENOL  ARTHRITIS PAIN) 650 mg tablet Take one tablet to two tablets by mouth every 12 hours as needed for Pain.    aspirin EC 81 mg tablet Take one tablet by mouth daily.    cholecalciferol  (VITAMIN D-3) 1,000 units tablet Take one tablet by mouth daily. Indications: vitamin D deficiency    clopiDOGrel (PLAVIX) 75 mg tablet Take one tablet by mouth daily.    evolocumab (REPATHA) 140 mg/mL injectable SYRINGE Inject 1 mL under the skin every 14 days.    ezetimibe (ZETIA) 10 mg tablet Take one tablet by mouth daily.    finasteride  (PROSCAR ) 5 mg tablet TAKE 1 TABLET BY MOUTH DAILY    ibuprofen (ADVIL) 200 mg tablet Take one tablet by mouth every 6 hours as needed for Pain. Take with food.    metoprolol tartrate (LOPRESSOR) 50 mg tablet Take one tablet by mouth twice daily.    MULTIVITAMIN PO Take 1 tablet by mouth daily.    Niacin 500 mg cpER Take  by mouth.    nitroglycerin (NITROSTAT) 0.4 mg tablet Place one tablet under tongue every 5 minutes as needed.    Omega-3 Acid Ethyl Esters 1 gram cap Take one capsule by mouth twice daily with meals.    senna/docusate (SENOKOT-S) 8.6/50 mg tablet Take one tablet by mouth daily. 50 mg tablet    traZODone  (DESYREL ) 100 mg tablet Take one tablet by mouth at bedtime as needed for Sleep.     Vitals:    03/04/24 1107  PainSc: Zero   Weight: 76.4 kg (168 lb 6.4 oz)   Height: 185.4 cm (6' 1)  Comment: per pt     Body mass index is 22.22 kg/m?SABRA     Pain Score: Zero       Fatigue Scale: 8    Pain Addressed:  N/A    Patient Evaluated for a Clinical Trial: Patient not eligible for a treatment trial (including not needing treatment, needs palliative care, in remission).     [place] [employer] performance status is 0, Fully active, able to carry on all pre-disease performance without restriction.SABRA     Physical Exam  Well developed, well appearing man in no acute distress.  He appears his stated age.    Breathing is non-labored, chest rise is equal bilaterally.  Neurologic exam is non-focal.  Mood and affect are normal.      Lab Results   Component Value Date    PSA 6.10 (H) 02/26/2024    PSA 5.59 01/30/2023    PSA 4.97 02/28/2022    PSA 3.70 02/22/2021    PSA 4.55 07/05/2020               Assessment and Plan:  Prostate cancer  -  Continue watchful waiting; return to clinic in 1 year with a PSA.    Timothy MYRTIS Kitty, MD  Urologic Oncology  Department of Urology

## 2024-06-04 ENCOUNTER — Encounter: Admit: 2024-06-04 | Discharge: 2024-06-04 | Payer: MEDICARE
